# Patient Record
Sex: Female | Born: 1965 | Race: Black or African American | Hispanic: No | Marital: Single | Smoking: Current some day smoker
Health system: Southern US, Community
[De-identification: ages and names within clinical notes are randomized; demographics above are authoritative.]

## PROBLEM LIST (undated history)

## (undated) DIAGNOSIS — J45909 Unspecified asthma, uncomplicated: Secondary | ICD-10-CM

## (undated) DIAGNOSIS — G039 Meningitis, unspecified: Secondary | ICD-10-CM

## (undated) DIAGNOSIS — F329 Major depressive disorder, single episode, unspecified: Secondary | ICD-10-CM

## (undated) DIAGNOSIS — J309 Allergic rhinitis, unspecified: Secondary | ICD-10-CM

## (undated) DIAGNOSIS — F32A Depression, unspecified: Secondary | ICD-10-CM

## (undated) DIAGNOSIS — IMO0001 Reserved for inherently not codable concepts without codable children: Secondary | ICD-10-CM

## (undated) DIAGNOSIS — J40 Bronchitis, not specified as acute or chronic: Secondary | ICD-10-CM

## (undated) HISTORY — DX: Allergic rhinitis, unspecified: J30.9

## (undated) HISTORY — PX: ABDOMINAL HYSTERECTOMY: SHX81

## (undated) HISTORY — DX: Unspecified asthma, uncomplicated: J45.909

---

## 2003-03-05 ENCOUNTER — Emergency Department (HOSPITAL_COMMUNITY): Admission: EM | Admit: 2003-03-05 | Discharge: 2003-03-05 | Payer: Self-pay | Admitting: *Deleted

## 2003-03-05 ENCOUNTER — Encounter: Payer: Self-pay | Admitting: *Deleted

## 2003-07-22 ENCOUNTER — Emergency Department (HOSPITAL_COMMUNITY): Admission: EM | Admit: 2003-07-22 | Discharge: 2003-07-22 | Payer: Self-pay | Admitting: Emergency Medicine

## 2003-07-22 ENCOUNTER — Encounter: Payer: Self-pay | Admitting: Emergency Medicine

## 2003-12-05 ENCOUNTER — Emergency Department (HOSPITAL_COMMUNITY): Admission: EM | Admit: 2003-12-05 | Discharge: 2003-12-05 | Payer: Self-pay | Admitting: Emergency Medicine

## 2004-05-01 ENCOUNTER — Emergency Department (HOSPITAL_COMMUNITY): Admission: EM | Admit: 2004-05-01 | Discharge: 2004-05-01 | Payer: Self-pay | Admitting: Emergency Medicine

## 2004-09-16 ENCOUNTER — Emergency Department (HOSPITAL_COMMUNITY): Admission: EM | Admit: 2004-09-16 | Discharge: 2004-09-16 | Payer: Self-pay | Admitting: Emergency Medicine

## 2004-12-16 ENCOUNTER — Emergency Department (HOSPITAL_COMMUNITY): Admission: EM | Admit: 2004-12-16 | Discharge: 2004-12-16 | Payer: Self-pay | Admitting: Emergency Medicine

## 2004-12-20 ENCOUNTER — Emergency Department (HOSPITAL_COMMUNITY): Admission: EM | Admit: 2004-12-20 | Discharge: 2004-12-20 | Payer: Self-pay | Admitting: Emergency Medicine

## 2005-08-15 ENCOUNTER — Emergency Department (HOSPITAL_COMMUNITY): Admission: EM | Admit: 2005-08-15 | Discharge: 2005-08-15 | Payer: Self-pay | Admitting: Emergency Medicine

## 2005-08-25 ENCOUNTER — Emergency Department (HOSPITAL_COMMUNITY): Admission: EM | Admit: 2005-08-25 | Discharge: 2005-08-25 | Payer: Self-pay | Admitting: Emergency Medicine

## 2006-03-18 ENCOUNTER — Ambulatory Visit: Payer: Self-pay | Admitting: Nurse Practitioner

## 2006-03-26 ENCOUNTER — Ambulatory Visit (HOSPITAL_COMMUNITY): Admission: RE | Admit: 2006-03-26 | Discharge: 2006-03-26 | Payer: Self-pay | Admitting: Family Medicine

## 2006-04-12 ENCOUNTER — Other Ambulatory Visit: Admission: RE | Admit: 2006-04-12 | Discharge: 2006-04-12 | Payer: Self-pay | Admitting: Family Medicine

## 2006-04-12 ENCOUNTER — Ambulatory Visit: Payer: Self-pay | Admitting: Nurse Practitioner

## 2006-04-24 ENCOUNTER — Encounter: Admission: RE | Admit: 2006-04-24 | Discharge: 2006-04-24 | Payer: Self-pay | Admitting: Family Medicine

## 2006-04-29 ENCOUNTER — Ambulatory Visit: Payer: Self-pay | Admitting: Nurse Practitioner

## 2006-05-12 ENCOUNTER — Emergency Department (HOSPITAL_COMMUNITY): Admission: EM | Admit: 2006-05-12 | Discharge: 2006-05-12 | Payer: Self-pay | Admitting: Emergency Medicine

## 2006-08-06 ENCOUNTER — Ambulatory Visit: Payer: Self-pay | Admitting: Nurse Practitioner

## 2006-08-08 ENCOUNTER — Ambulatory Visit (HOSPITAL_COMMUNITY): Admission: RE | Admit: 2006-08-08 | Discharge: 2006-08-08 | Payer: Self-pay | Admitting: Family Medicine

## 2006-08-23 ENCOUNTER — Ambulatory Visit: Payer: Self-pay | Admitting: Nurse Practitioner

## 2006-08-26 ENCOUNTER — Ambulatory Visit (HOSPITAL_COMMUNITY): Admission: RE | Admit: 2006-08-26 | Discharge: 2006-08-26 | Payer: Self-pay | Admitting: Family Medicine

## 2006-09-02 ENCOUNTER — Ambulatory Visit: Payer: Self-pay | Admitting: Nurse Practitioner

## 2006-10-07 ENCOUNTER — Inpatient Hospital Stay (HOSPITAL_COMMUNITY): Admission: EM | Admit: 2006-10-07 | Discharge: 2006-10-08 | Payer: Self-pay | Admitting: Emergency Medicine

## 2006-10-24 ENCOUNTER — Ambulatory Visit (HOSPITAL_COMMUNITY): Admission: RE | Admit: 2006-10-24 | Discharge: 2006-10-25 | Payer: Self-pay | Admitting: Obstetrics and Gynecology

## 2006-10-24 ENCOUNTER — Encounter (INDEPENDENT_AMBULATORY_CARE_PROVIDER_SITE_OTHER): Payer: Self-pay | Admitting: Specialist

## 2007-01-16 ENCOUNTER — Ambulatory Visit: Payer: Self-pay | Admitting: Internal Medicine

## 2007-01-16 ENCOUNTER — Inpatient Hospital Stay (HOSPITAL_COMMUNITY): Admission: EM | Admit: 2007-01-16 | Discharge: 2007-01-17 | Payer: Self-pay | Admitting: Emergency Medicine

## 2007-01-20 ENCOUNTER — Ambulatory Visit: Payer: Self-pay | Admitting: Nurse Practitioner

## 2007-01-27 ENCOUNTER — Ambulatory Visit: Payer: Self-pay | Admitting: Nurse Practitioner

## 2008-02-07 ENCOUNTER — Emergency Department (HOSPITAL_COMMUNITY): Admission: EM | Admit: 2008-02-07 | Discharge: 2008-02-07 | Payer: Self-pay | Admitting: Emergency Medicine

## 2008-04-25 ENCOUNTER — Emergency Department (HOSPITAL_COMMUNITY): Admission: EM | Admit: 2008-04-25 | Discharge: 2008-04-25 | Payer: Self-pay | Admitting: Emergency Medicine

## 2009-04-12 ENCOUNTER — Emergency Department (HOSPITAL_COMMUNITY): Admission: EM | Admit: 2009-04-12 | Discharge: 2009-04-12 | Payer: Self-pay | Admitting: Emergency Medicine

## 2009-12-29 ENCOUNTER — Emergency Department (HOSPITAL_COMMUNITY): Admission: EM | Admit: 2009-12-29 | Discharge: 2009-12-29 | Payer: Self-pay | Admitting: Emergency Medicine

## 2011-02-25 LAB — DIFFERENTIAL
Basophils Absolute: 0 10*3/uL (ref 0.0–0.1)
Eosinophils Relative: 0 % (ref 0–5)
Lymphocytes Relative: 16 % (ref 12–46)
Lymphs Abs: 1.5 10*3/uL (ref 0.7–4.0)
Neutro Abs: 6.9 10*3/uL (ref 1.7–7.7)

## 2011-02-25 LAB — COMPREHENSIVE METABOLIC PANEL
AST: 42 U/L — ABNORMAL HIGH (ref 0–37)
BUN: 9 mg/dL (ref 6–23)
CO2: 22 mEq/L (ref 19–32)
Calcium: 8.3 mg/dL — ABNORMAL LOW (ref 8.4–10.5)
Chloride: 107 mEq/L (ref 96–112)
Creatinine, Ser: 1.04 mg/dL (ref 0.4–1.2)
GFR calc Af Amer: >60 mL/min (ref >60)
GFR calc non Af Amer: 57 mL/min — ABNORMAL LOW (ref >60)
Total Bilirubin: 0.3 mg/dL (ref 0.3–1.2)

## 2011-02-25 LAB — URINE MICROSCOPIC-ADD ON

## 2011-02-25 LAB — URINALYSIS, ROUTINE W REFLEX MICROSCOPIC
Nitrite: NEGATIVE
Protein, ur: 30 mg/dL — AB
Specific Gravity, Urine: 1.027 (ref 1.005–1.030)
Urobilinogen, UA: 0.2 mg/dL (ref 0.0–1.0)

## 2011-02-25 LAB — CBC
HCT: 40.4 % (ref 36.0–46.0)
MCHC: 32.8 g/dL (ref 30.0–36.0)
MCV: 76.5 fL — ABNORMAL LOW (ref 78.0–100.0)
Platelets: 273 10*3/uL (ref 150–400)

## 2011-04-27 NOTE — Consult Note (Signed)
Cindy Harrell, Cindy Harrell                ACCOUNT NO.:  [id number]   MEDICAL RECORD NO.:  [id number]          PATIENT TYPE:  INP   LOCATION:  1830                         FACILITY:  [facility]   PHYSICIAN:  Zola Button T. Lazarus Harrell, M.D. DATE OF BIRTH:  1966-10-31   DATE OF CONSULTATION:  01/16/2007  DATE OF DISCHARGE:                                 CONSULTATION   CHIEF COMPLAINT:  Chronic cough.   HISTORY OF PRESENT ILLNESS:  This 45 year old black female has had  paroxysms of a wracking cough off and on for the past 3-4 months.  The  cough is essentially never productive.  She does not produce any blood.  She denies sinus disease or reflux.  She denies foreign body ingestion  or trauma.  She had a hysterectomy back in November and the cough  predates that intubation.  She does smoke.  She claims to have had  similar coughing episodes in the distant past, but cannot really  describe this.  She has been evaluated in the emergency room for the  possibility of a pulmonary embolism.  CT scan angiogram apparently was  completely normal.  She has a mild elevation of white count following  steroid therapy with a significant left shift, but thus far unexplained.  ENT was called in consultation for the possibility of vocal cord  dysfunction after she failed to respond on several nebulizer treatments.  At present, she does not report dyspnea or actual wheezing, but more  just this significant cough.   PHYSICAL EXAMINATION:  GENERAL:  This is a somewhat tired-appearing  middle-aged black female.  RESPIRATORY:  She does have a vigorous cough which sounds sticky but not  actually productive.  In between, her voice sounds normal and her  breathing is comfortable without any end-expiratory wheezing.  MENTAL STATUS:  Essentially intact.  She hears well in conversational  speech.  HEENT:  The head is atraumatic and neck is supple.  Ear canals are clear  with normal aerated drums.  Anterior nose is clear and not  congested.  Oral cavity is moist with teeth in good repair.  Oropharynx is clear.  I  could not easily see nasopharynx or hypopharynx.  NECK:  Without  adenopathy.  No thyromegaly or tenderness.  Laryngeal crepitus is  normal.   PROCEDURE:  Following 1% viscous Xylocaine to both sides of the nose,  the flexible laryngoscope was introduced.  She has a small midline  submucous retention cyst in the nasopharynx with normal eustachian tori.  Oropharynx is clear.  Hypopharynx reveals some redundant mucosa  postcricoid and some irregularity of the posterior commissure mucosa.  The vocal cords are fully mobile without lesions or contact granulomata.  The airway is good.  No pooling in valleculae or piriformis.  When she  does cough in her standard fashion, the vocal cords appear mobile and do  abduct and adduct appropriately.   IMPRESSION:  Chronic cough.  She has probable chronic laryngitis which I  would initially attribute to reflux.  I would deliberately rule out  chronic sinusitis with a CT scan.  It is possible  this has become an  habitual cough and, in fact, she seemed to be better following the  viscous Xylocaine topical anesthesia of the pharynx.  I do not think  there is complete proof that she does not have an intrinsic pulmonary  process.  I do not see any evidence of wheezing or vocal cord  dysfunction.   PLAN:  CT scan of the sinuses without contrast.  I would be b.i.d.  proton pump inhibitors as a diagnostic and therapeutic trial.  I would  encourage her not to cough heavily if she can avoid it.  A diagnostic  trial of 1% plain Xylocaine by handheld nebulizer might be interesting.  I will leave the remainder of her workup to the capable internal  medicine evaluation she is receiving.      Cindy Harrell, M.D.  Electronically Signed     KTW/MEDQ  D:  01/16/2007  T:  01/16/2007  Job:  [id number]   cc:   Coralie Carpen, M.D.  Cindy Harrell, M.D.

## 2011-04-27 NOTE — Discharge Summary (Signed)
Cindy Harrell, Cindy Harrell                ACCOUNT NO.:  [id number]   MEDICAL RECORD NO.:  [id number]          PATIENT TYPE:  INP   LOCATION:  3735                         FACILITY:  [facility]   PHYSICIAN:  Duncan Dull, M.D.     DATE OF BIRTH:  11-26-1966   DATE OF ADMISSION:  01/16/2007  DATE OF DISCHARGE:  01/17/2007                               DISCHARGE SUMMARY   DISCHARGE DIAGNOSES:  1. Shortness of breath, believed to be multifactorial.  2. Acute on-chronic laryngitis.  3. Chronic bronchitis/chronic obstructive pulmonary disease.  4. Asthma.  5. Acute maxillary sinusitis.  6. Chronic and ongoing tobacco abuse.  7. Gastroesophageal reflux disease.  8. History of menorrhagia status post hysterectomy, November2007.  9. History of postnasal drip.   DISCHARGE MEDICATIONS:  1. Augmentin 500/125 mg 1 tablet p.o. b.i.d. x10 days.  2. Allegra-D 1 tab p.o. daily.  3. Chantix, one week's starter supply with 3 months' refills.  4. AYR nasal saline spray over-the-counter, 2-3 squirts each nostril      p.r.n. nasal congestion.  5. Afrin nasal spray 2-3 sprays each nostril x3 days then stop.  6. Albuterol 90 mcg MDI, 2 puffs inhaled p.r.n. shortness of breath.  7. Advair 100/50, 1 puff inhaled daily.  8. DuoNeb p.r.n. shortness of breath.   DISPOSITION AND FOLLOWUP:  Cindy Harrell is being discharged from [facility] in stable condition.  She is to complete a 10-day course of  oral antibiotics for acute maxillary sinusitis.  Following this, she  should follow-up with her primary care physician at [facility] in approximately 2 weeks.  [facility] does not book this far  out, so she is instructed to call them to gain her appointment again in  2 weeks' time.  At that point, her primary care physician should  evaluate her for full resolution of her acute shortness of breath.  Also, the primary care physician should consider setting Cindy Harrell up  for outpatient pulmonary function  tests.  At this point, I believe [name] suffers from chronic bronchitis/COPD.  However, documentation of  an obstructive airway disease, if there is a reversible component may be  helpful in the future.   PROCEDURES PERFORMED:  1. Chest x-ray revealed changes compatible with obstructive airways      disease, asthma, no focal air space disease.  The lungs were hyper-      expanded.  CT angiography of the chest revealed no pulmonary      embolus or acute cardiopulmonary disease.  She did have 3-4 sub-      centimeter nodules.  These are likely benign non-calcified      granulomas or subpleural lymph nodes.  Follow-up CT is recommended      in 9 months, to ensure stability or resolution.  2. Maxillofacial CT scan revealed acute left maxillary sinusitis with      an air-fluid level, with scattered minor sinus mucosal thickening.  3. Flexible laryngoscopy revealed midline sub-mucous retention cyst in      the nasopharynx with normal eustachian tori, oropharynx clear,  hypopharynx revealed some redundant mucosa, postcricoid and some      irregularity of the posterior commissure mucosa.  Vocal cords fully      mobile without lesions or contact granulomata.  Airway is good.  No      pooling in valleculae or piriformis.  When she does cough in the      standard fashion, vocal cords appear mobile and do abduct and      adduct appropriately.   CONSULTATIONS:  Dr. Flo Shanks of Otolaryngology.   BRIEF ADMISSION HISTORY AND PHYSICAL:  Cindy Harrell is a 45 year old  African-American woman with a past history of chronic bronchitis, who  presents with a 2-day history of worsening shortness of breath and  cough.  Her symptoms started with a prodromal of sore throat, fevers,  chills, approximately 3 days prior to admission.  She notably was in [place] with her family, learning of her mother's recent diagnosis of a  neuro-endocrine tumor.  She began feeling short of breath when the  weather  became cold there and started snowing, approximately 2 days  prior to arrival.  She was treating herself with over-the-counter cough  syrup and Advair, with no relief of her symptoms over that day.  Approximately 1 day prior to admission, the patient returned to  [facility] via a long car trip from [place], and her shortness of  breath and cough were progressively worsened.  She began using her  albuterol MDI and DuoNebs again, without significant relief of her  symptoms.  On the morning of admission, she was attempting leave her  home and subjectively he felt quite distressed.  Her shortness of breath  was severe and she was moving very little air in her opinion.  Exacerbating this was a persistent hacking, nonproductive cough.  She  came to the ED for further evaluation.  Please see the chart for further  details.   ADMISSION PHYSICAL EXAM:  VITAL SIGNS:  Temperature 98.5, blood pressure  155/85, pulse 146, respiratory rate of 40, O2 SATs 92% on room air.  GENERAL:  The patient was alert, oriented.  She was in mild to moderate  respiratory distress.  HEENT:  Pupils were pinpoint but reactive to light.  NECK:  She had no cervical adenopathy, no thyromegaly.  Neck was supple.  RESPIRATORY:  Exam revealed the tachypnea with no wheezes, rhonchi,  occasional scattered rales.  However, this was after Solu-Medrol, as  well as nebulizers.  CARDIOVASCULAR:  Tachycardic but regular.  No obvious murmurs, rubs or  gallops.  ABDOMEN:  Soft, nontender, nondistended.  EXTREMITIES:  Reveal no edema.  NEUROLOGIC:  Cranial nerves II-XII were grossly intact.  No focal  deficit, strength or sensation.   ADMISSION LABS:  Sodium 134, potassium 3.1, chloride 103, bicarb 21, BUN  7, creatinine 0.9, glucose 121, bilirubin 0.4, alk phos 70, AST 51, ALT  26, protein 6.6, albumin 3.5, calcium 8.8, white blood cell count 11.3,  hemoglobin 11.9, platelets 268, ANC 10.7, MCV of 75.9, D-dimer 2.39. Arterial  blood gas:  pH 7.39, pCO2 32.3, pO2 68, this on 1 liter nasal  cannula, bicarb 19.5.  EKG revealed normal sinus rhythm with nonspecific  T-wave changes.  Imaging was as described above in procedures.   HOSPITAL COURSE:  1. Shortness of breath.  Given the history of recent travel, PE was      suspected but ruled out with a CT angiography.  There were several      nodes as  described above, which needed to be followed up in      approximately 9 months' time with a repeat chest CT.  I believe her      shortness of breath was probably multifactorial including an acute      on-chronic bronchitis, an acute on-chronic laryngitis, worsened by      chronic obstructive pulmonary disease and a question of reversible      airway disease or asthma.  For this reason, the patient was treated      with IV steroids, nebulized bronchodilators, as well as      antibiotics.  I am discharging the patient on a steroid taper of 60      mg, to be tapered down by 10 mg each day over the next 6 days.  The      patient is also being discharged on a decongestant and      antihistamine and Allegra-D, as well as AYR nasal saline spray and      3 days worth of Afrin nasal spray.  She has been instructed to      continue her Advair every day and to use her albuterol and DuoNeb      as needed.  I think obtaining pulmonary function tests will further      clarify the obstructive component, as well as the reversible      component of her pulmonary disease.  Judging by her chest x-ray of      hyperexpansion, she does have changes consistent with COPD, and I      do believe the patient has a component of asthma secondary to the      exacerbation of cold, as well as trigger of her disease from moving      to [place].  2. Acute maxillary sinusitis, diagnosed by CT of sinuses.  The patient      was treated initially with Unasyn which was transitioned to      Augmentin.  She will complete a total of 10 days of therapy.       Again, she will use AYR nasal spray to relieve any congestion,      along with aspirin nasal spray for 3 days.  The patient is      understanding of this and is instructed to return the hospital or      her primary care physician, if her symptoms worsen.  3. Hypokalemia.  I believe this may be secondary to use of beta      agonist.  The patient was repleted, and her potassium on day of      discharge was 4.0.  4. Polysubstance abuse.  Urine drug screen was positive for opiates as      well as THC.  I will refer the patient to narcotics anonymous in      regards to her drug use.  I believe smoking marijuana most likely      is exacerbating her condition.  5. Chronic and ongoing tobacco abuse.  The patient is finally      interested in quitting smoking.  I have given her prescription for      a starter pack of Chantix, as well as 3 months of refills.  6. Leukocytosis.  I believe this is secondary to her acute infection,     as well as stress response to steroids.  Follow up with the primary      care physician should include a CBC to ensure full resolution  of      her leukocytosis.  She is afebrile and clinically much improved and      stable at the time of discharge.  7. Tachycardia on admission.  I believe this was secondary to the beta      agonist she was receiving.  On the day of discharge, her pulse was      listed as 106 in E-chart.  I have rechecked it manually, and her      pulse was 80-85 by my check.  Again, I think this is secondary to      beta stimulation.  Cardiac enzymes were obtained x3 which revealed      a troponin of 0.02, 0.03, this was reassuring.  Notably, she did      have an elevation in her CK, which I think is secondary to the      vigorous coughing that she has had and muscle inflammation.      Creatinine stable, 0.68.   DISCHARGE LABS AND VITALS:  Temperature 98.0, blood pressure 127/88,  pulse of 80-85, respiratory rate of 20.  She was satting 95% on  room  air.   LABS:  White blood cell count 14.9, hemoglobin 12.1, platelets 265,  sodium 135, potassium 4.0, chloride 26, bicarb 22, BUN 8, creatinine  0.68, glucose 125.  Hemoglobin A1c was 5.7.  Coags were normal with a PT  of 16, PTT of 33.  Influenza A and B nasal swab were negative.  HIV was  nonreactive.  ESR was 10.  Ferritin was 114.  BNP was 56.      Antony Contras, M.D.  Electronically Signed      Duncan Dull, M.D.  Electronically Signed    GL/MEDQ  D:  01/17/2007  T:  01/18/2007  Job:  [id number]   cc:   Gloris Manchester. Lazarus Salines, M.D.

## 2011-04-27 NOTE — Op Note (Signed)
NAMEJAZZ, Cindy Harrell                ACCOUNT NO.:  [id number]   MEDICAL RECORD NO.:  [id number]          PATIENT TYPE:  AMB   LOCATION:  SDC                           FACILITY:  [facility]   PHYSICIAN:  Zenaida Niece, M.D.DATE OF BIRTH:  May 15, 1966   DATE OF PROCEDURE:  10/24/2006  DATE OF DISCHARGE:                                 OPERATIVE REPORT   PREOPERATIVE DIAGNOSES:  1. Menorrhagia.  2. Dysmenorrhea.  3. Pelvic pain.  4. Cystocele.  5. Stress urinary incontinence.   PROCEDURES:  Laparoscopic-assisted vaginal hysterectomy with anterior  colporrhaphy and TVT Secur.   SURGEON:  Zenaida Niece, M.D.   ASSISTANT:  Huel Cote, M.D.   ANESTHESIA:  General endotracheal tube.   SPECIMENS:  Uterus sent to pathology.   ESTIMATED BLOOD LOSS:  450 mL.   COMPLICATIONS:  None.   FINDINGS:  She had a normal upper abdomen and appendix.  Uterus was slightly  enlarged and broad.  She had normal tubes and ovaries with evidence of prior  tubal ligation.  She had a grade I-II cystocele.  She had no significant  rectocele.   PROCEDURE IN DETAIL:  The patient was taken to the operating room and placed  in the dorsal supine position.  General anesthesia was induced and she was  placed in mobile stirrups.  Abdomen, perineum and vagina were then prepped  and draped in the usual sterile fashion.  The bladder was drained with a red  rubber catheter.  Infraumbilical skin was then infiltrated with 0.25%  Marcaine and a 1 cm horizontal incision was made in a fold.  A disposable  Veress needle was inserted into the peritoneal cavity and placement  confirmed by the water drop test and an opening pressure of 6 mmHg.  CO2 gas  was insufflated to a pressure of 12 mmHg and the Veress needle was removed.  A 5 mm XL trocar was then introduced with direct visualization.  The 5 mm  ports were then placed on each side lateral to the epigastric vessels also  under direct visualization.  Inspection  revealed the above-mentioned  findings.  A 5 mm laparoscopic tenaculum was used to grasp the uterus and  starting on the patient's right side, the fallopian tube, utero-ovarian  pedicle, round ligament and broad ligaments were taken down with the  Harmonic scalpel ACE.  There was scarring in the anterior cul-de-sac from  her prior cesarean section and this was taken down in layers with the  Harmonic scalpel.  Uterine arteries were skeletonized on the right and taken  down with Harmonic scalpel.  On the left side there  an adhesion of the  ovary to the ovarian fossa and this was taken down with Harmonic scalpel.  The uterus was fairly broad at the cornu and this made taking this down the  pedicles slightly difficult.  Fallopian tube, utero-ovarian pedicle, round  ligament and broad ligament were taken down with the Harmonic scalpel ACE.  Bipolar cautery was used to assure hemostasis.  The anterior peritoneum was  identified and incised across the anterior portion of uterus,  again in  layers due to the scar tissue and the bladder was pushed inferior.  Uterine  arteries were skeletonized and taken down with Harmonic scalpel ACE.  All  pedicles were hemostatic and we proceeded vaginally.   Legs were elevated in the stirrups and a weighted speculum was placed into  the vagina.  Deaver retractors were used anteriorly and laterally.  Cervix  was grasped with Christella Hartigan tenaculums and the cervicovaginal mucosa was  infiltrated with dilute solution of Pitressin.  The cervicovaginal mucosa  was then incised circumferentially with electrocautery.  Sharp dissection  was then used to further free the vagina from the cervix.  Anterior  peritoneum was identified and entered bluntly and a Deaver retractor used to  retract the bladder anteriorly.  Posterior cul-de-sac was mobilized and  entered sharply and a Bonanno speculum placed into the posterior cul-de-sac.  Uterosacral pedicles were then clamped,  transected and ligated on each side  with #1 chromic and tagged for later use.  The remainder of the cardinal  ligament and uterine arteries were were clamped, transected and ligated with  #1 chromic and the uterus was removed.  Bleeding from the patient's right  side was controlled with two figure-of-eight sutures of #1 chromic and all  other pedicles were hemostatic.  Suture of 2-0 silk was used to plicate the  uterosacral ligaments in the midline.  The previously tagged uterosacral  pedicles were also tied in the midline.  Vagina was left open at this point.   Attention was turned to the anterior colporrhaphy.  The anterior vaginal  apex was grasped with Allis clamps and the vagina dissected in the midline  to within about 3 cm of the urethral meatus.  Cystocele was then mobilized  laterally, both sharply and bluntly.  Cystocele was then reduced with  interrupted sutures of 2-0 Vicryl.  Bleeding was controlled with  electrocautery and interrupted sutures of 3-0 Vicryl.  Excess vaginal mucosa  was then removed sharply.  The anterior colporrhaphy incision was closed in  a vertical fashion with running locking 2-0 Vicryl to the vaginal apex.  The  vaginal cuff was then also closed with running locking 2-0 Vicryl in a  vertical fashion with adequate closure and adequate hemostasis.   No significant rectocele was identified so attention was turned to the TVT.  The legs were lowered about halfway in the stirrups.  The vagina was grasped  at a distance that was felt to be at the mid urethra.  A vertical incision  was made and carried out a short distance laterally with the knife.  Sharp  dissection with Metzenbaum scissors then dissected out to the pubic ramus  bilaterally.  This was wide enough to allow the knife handle to pass to the  pubic ramus.  The TVT Secur device was then placed first on the patient's right side and then on the patient's left side just behind the pubic ramus.  A  right-angle clamp was able to be passed between the urethra and the tape.  It was slightly snug at first but then the TVT was slightly removed and good  tension was achieved.  The needles were freed from the sling and removed.  The vaginal mucosa was closed with running locking 2-0 Vicryl.  Cystoscopy  was performed.  She had been given indigo carmine IV.  There was no injury  to the bladder and both ureteral orifices were easily identified and had  good efflux of blue urine.  No injury  to the urethra or the bladder from the  sling or the anterior repair.  The cystoscope was removed and a Foley  catheter was placed.  Of note, prior to placement of TVT, the bladder had  been drained.  The legs were now elevated again in stirrups.  Two inch  packing was placed into the vagina after it was again inspected and found to  be hemostatic.   Legs were lowered and attention was turned back to laparoscopy.  Both Dr.  Senaida Ores and I and the scrub techs changed gloves.  The abdomen was  reinsufflated with CO2 gas and inspected.  It was copiously irrigated.  A  small amount of bleeding from the right infundibulopelvic pedicle was  controlled with bipolar cautery.  The remainder of the pelvis was  hemostatic.  The lateral ports were removed with direct visualization.  All  gas was allowed to deflate from the abdomen and the umbilical trocar was  then removed.  Skin incisions were closed with interrupted  subcuticular sutures of 4-0 Vicryl followed by Dermabond.  The patient  tolerated the procedure well.  She was extubated in the operating room and  taken to the recovery room in stable condition.  Counts were correct x2, she  was given Ancef 1 gram prior to the procedure and had PAS hose on throughout  procedure.      Zenaida Niece, M.D.  Electronically Signed     TDM/MEDQ  D:  10/24/2006  T:  10/24/2006  Job:  [id number]

## 2011-04-27 NOTE — Discharge Summary (Signed)
Cindy Harrell, Cindy Harrell                ACCOUNT NO.:  [id number]   MEDICAL RECORD NO.:  [id number]          PATIENT TYPE:  INP   LOCATION:  4731                         FACILITY:  [facility]   PHYSICIAN:  Lonia Blood, M.D.       DATE OF BIRTH:  [birth year]   DATE OF ADMISSION:  10/07/2006  DATE OF DISCHARGE:  10/08/2006                                 DISCHARGE SUMMARY   PRIMARY CARE PHYSICIAN:  [facility].   DISCHARGE DIAGNOSES:  1. Acute bronchitis.  2. Tobacco abuse.  3. Mild asthma exacerbation.  4. Menorrhalgia.  5. Mild gastroesophageal reflux disease.  6. Postnasal drip.   DISCHARGE MEDICATIONS:  1. Advair 250/50 mg, one puff twice a day.  2. Singulair 10 mg daily.  3. Albuterol MDI, 1 puff four times a day as needed.  4. Doxycycline 100 mg by mouth for a week.  5. Rhinocort 1 spray twice a day.  6. Omeprazole 20 mg daily.  7. Prednisone taper   CONDITION ON DISCHARGE:  [name] was discharged in good condition.   At time of discharge she was instructed to follow up with her primary care  physician at [facility].   PROCEDURE:  During this admission, on October 07, 2006, the patient  underwent a chest x-ray PA and lateral with the findings of clear lungs and  no active cardiopulmonary disease.   CONSULTATION:  No consultations were obtained.   For admission history and physical please refer to the dictated H&P done by  Dr. Jetty Duhamel on October 07, 2006.   HOSPITAL COURSE:  Acute bronchitis and asthma, COPD exacerbation.  [name]  was admitted with complaints of persistent coughing and shortness of breath.  She was placed on high doses of steroids and nebulizer treatment three times  a day.  By hospital day #2, her symptoms improved significantly.  She was  evaluated the next day and was found to be without any respiratory distress  and her lung exam was pretty unremarkable.  The patient reported that she  has been having recurrent bronchitic events throughout her  life and then she  has been having trouble with breathing attacks at night.  It is felt that  this patient may suffer from possible chronic bronchitis from tobacco abuse  and she may have also a case of hyper reactive airways from probable mild  asthma.  In any case, [name] was instructed to quit smoking cigarettes  and she was strongly encouraged to use Chantix to help her with  the craving symptoms.  Also [name] was given state of the art treatment  for her chronic cough with a cough suppressant, acid suppression medication,  and also a nasal cortisone spray.  Further decisions about [name]'s  chronic lung condition should be done by her primary care physician at  [facility].      Lonia Blood, M.D.  Electronically Signed     SL/MEDQ  D:  10/09/2006  T:  10/09/2006  Job:  [id number]   cc:   Dala Dock

## 2011-04-27 NOTE — H&P (Signed)
[name], [name]                ACCOUNT NO.:  [id number]   MEDICAL RECORD NO.:  [id number]          PATIENT TYPE:  INP   LOCATION:  1826                         FACILITY:  [facility]   PHYSICIAN:  Lonia Blood, M.D.DATE OF BIRTH:  06-28-1966   DATE OF ADMISSION:  10/07/2006  DATE OF DISCHARGE:                                HISTORY & PHYSICAL   CHIEF COMPLAINT:  Shortness of breath.   HISTORY OF PRESENT ILLNESS:  [name]. Cindy Harrell is a pleasant 45 year old  female who smokes less than a pack a day but has done so for greater than 20  years.  She has frequent trouble with exacerbations of acute bronchitis and  with asthma/emphysema flares.  Over the last month, she has visited her  doctor at [facility] on multiple occasions.  She was previously placed on  Cipro.  Over the past 1 week, she has had gradually progressive difficulty  with shortness of breath.  This has been accompanied by a cough  intermittently productive of thick, green and yellow phlegm.  Over the last  24 hours, her symptoms have worsened.  Despite use of home albuterol  and  Atrovent nebs, as well as her Advair, Zyrtec and Singulair, her symptoms  worsened to the point she felt that she could no longer catch her breath.  She felt a severe sensation of tightness across her entire chest, like a  band tightening down.  She therefore presented to the emergency room.  In  the emergency room, she was dosed with IV Solu-Medrol 125 mg.  She was given  frequent nebs.  Symptoms improved but did not resolve sufficiently to allow  discharge home.  I am therefore called to admit her to the hospital.  At  present, the patient is resting comfortably in a hospital stretcher.  She  continues to report some difficulty with respiratory distress.  She is able  to be heard wheezing as one enters the room.  She has no other significant  complaints at the present time.   REVIEW OF SYSTEMS:  Patient has a known very large fibroid  which has been  causing painful intercourse and heavy bleeding with her menses.  She is  scheduled to see a gynecologist at 1:30 in the afternoon on October 08, 2006  to have this evaluated.  Otherwise, comprehensive review of systems is  unremarkable, with the exception of multiple positive elements noted in the  history of present illness above.   PAST MEDICAL HISTORY:  1. Tobacco abuse in the amount of less than 1 pack per day x 20+ years.  2. Chronic bronchitis.  3. Status post C-section.  4. Known uterine fibroids.  Schedule for a GYN evaluation and hopeful      resection.   OUTPATIENT MEDICATIONS:  1. Advair.  2. Zyrtec.  3. Albuterol and Atrovent nebs.  4. Cipro 500 mg b.i.d., prescribed August 23, 2006 but not taken      consistently.  5. Singulair q. day.   ALLERGIES:  NO KNOWN DRUG ALLERGIES.   FAMILY HISTORY:  Patient's mother is alive with  diabetes and a neuro-  endocrine cancer.  Patient's father is unknown.  Patient's brother has  sickle cell disease.   SOCIAL HISTORY:  The patient occasionally partakes of alcohol.  She admits  to frequent marijuana use.  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  She is  engaged to be married.   DATA:  Reviewed.  Hemoglobin is low at 11, with MCV of 76.  White count is  normal.  Platelet count is normal.  Sodium is normal.  Potassium is low at  3.2.  Chloride, bicarb, BUN and creatinine are normal.  Serum glucose is  114.  Chest x-ray reveals no acute disease.  There are obvious findings  consistent with asthma versus emphysema.   PHYSICAL EXAMINATION:  VITAL SIGNS:  Temperature 99.1, blood pressure  123/83, heart rate 120, respiratory rate 26, O2 sat is 98% on room air.  GENERAL:  Well-developed, well-nourished female in mild respiratory  distress, using accessory muscles.  HEENT:  Normocephalic, atraumatic.  Pupils equal, round, reactive to light  and accommodation.  Extraocular muscles intact bilaterally.  Bilateral OC/OP   clear.  NECK:  No JVD, no lymphadenopathy, no thyromegaly.  LUNGS:  Diffuse expiratory wheeze throughout with prolonged expiratory phase  and use of expiratory muscles and pursed lip breathing.  No focal crackles  but course upper airway sounds throughout.  CARDIOVASCULAR:  Tachycardic but regular without gallop or rub, normal S1  and S2.  ABDOMEN:  Nontender, nondistended, soft.  Bowel sounds present.  No  hepatosplenomegaly, no rebound, no ascites.  EXTREMITIES:  No significant cyanosis, clubbing, edema bilateral lower  extremities.  NEUROLOGIC:  Alert and oriented x4.  Cranial nerves 2-12 intact.  MUSCULOSKELETAL:  5/5 strength, bilateral upper and lower extremities, no  Babinski.  SKIN:  Patient has what appears to be an epidermal inclusion cyst at the  left earlobe.  When gloves are adorned and site pressure is placed, a  significant amount of sebum is able to be expressed from the area.  There is  no severe erythema.  There is no evidence of overlying cellulitis.  There  area is cleaned with rubbing alcohol and will be monitored.   IMPRESSION/PLAN:  1. Acute exacerbation of chronic obstructive pulmonary disease.  Patient      will be placed on IV Solu-Medrol at 80 mg IV q.8.h.  We will place her      on albuterol and Atrovent nebs on a regular basis.  She will be      observed closely.  O2 will be administered on a p.r.n. basis.  2. Acute bronchitis.  A bout of acute bronchitis is likely the inciting      event of patient's acute COPD/emphysema exacerbation.  I am placing of      the patient's on IV antibiotics.  We will place the patient on an      expectorant.  We will follow her symptoms closely.  Follow-up chest x-      ray will be accomplished in the morning.  At present, there is no      evidence of a pneumonia.  3. Tobacco abuse.  Patient has been counseled extensively as to the direct     connection between her ongoing tobacco abuse and her current illness.      She has  been advised of the multiple deleterious affects of ongoing      tobacco abuse.  She has been counseled to discontinue tobacco abuse  immediately.  A tobacco cessation consultation will be requested to      further drive this point home during her hospital stay.  4. Normocytic anemia.  The patient reports severe heavy bleeding with her      menses.  She has a known uterine fibroid.  At this point, we will      presume that this is the cause of her normocytic anemia and will not      pursue this further.  After such time that the patient's fibroid has      been addressed, patient hemoglobin will need to be followed closely.      Lonia Blood, M.D.  Electronically Signed    JTM/MEDQ  D:  10/07/2006  T:  10/07/2006  Job:  [id number]

## 2011-04-27 NOTE — H&P (Signed)
NAMEANGELIAH, WISDOM                ACCOUNT NO.:  [id number]   MEDICAL RECORD NO.:  [id number]          PATIENT TYPE:  AMB   LOCATION:  SDC                           FACILITY:  [facility]   PHYSICIAN:  Zenaida Niece, M.D.DATE OF BIRTH:  July 10, 1966   DATE OF ADMISSION:  10/24/2006  DATE OF DISCHARGE:                                HISTORY & PHYSICAL   CHIEF COMPLAINT:  Menorrhagia, dysmenorrhea, pelvic pain, cystocele and  stress urinary incontinence.   HISTORY OF PRESENT ILLNESS:  This is a 45 year old black female, gravida 4,  para 2-0-2-2, whom I saw initially on October 08, 2006.  She complains of  menses every month which are significantly heavy and crampy.  She has had an  ultrasound and MRI which document a small fibroid.  She also has fairly  constant pelvic pain.  She also is sexually active with dyspareunia  approximately 90% of the time, and also complains of urine leakage with  cough to the point where she has to wear a pad.   Exam in the office revealed a grade 1-2 cystocele, and she did leak just on  exam with straining.  On bimanual exam, uterus is anteverted, upper limits  of normal size, and slightly tender, and she has slightly tender bilateral  adnexa without masses.   All options have been discussed with the patient, and she wishes to proceed  with definitive surgical therapy.   PAST OBSTETRICAL HISTORY:  One vaginal delivery at term and one low  transverse cesarean section, as well as 2 elective terminations.   PAST MEDICAL HISTORY:  1. History of meningitis.  2. Asthma.  3. Bronchitis.  (The patient has no history of venous thromboembolic disease).   PAST SURGICAL HISTORY:  1. Tubal ligation.  2. Cesarean section.   ALLERGIES:  None known.   CURRENT MEDICATIONS:  1. Advair.  2. Singulair.  3. Rhinocort.  4. Prednisone.  5. Protonix.  6. Albuterol.  7. Chantix.  8. Doxycycline.   GYNECOLOGIC HISTORY:  No history of abnormal Pap smears or  sexually  transmitted diseases.   FAMILY HISTORY:  Mother had liver and pancreatic cancer of some  neuroendocrine origin.   REVIEW OF SYSTEMS:  She has normal bowel movements, and again does have  stress incontinence.   SOCIAL HISTORY:  She is single and does smoke about 5 cigarettes a day.   PHYSICAL EXAMINATION:  GENERAL:  This is a well-developed black female in no  acute distress.  VITAL SIGNS:  Weight is 143 pounds.  NECK:  Supple without lymphadenopathy or thyromegaly.  LUNGS:  Currently are clear to auscultation.  HEART:  Regular rate and rhythm without murmur.  ABDOMEN:  Soft and nondistended, but is slightly tender in both lower  quadrants.  She has no palpable masses, and does have a vertical scar from  her cesarean section.  EXTREMITIES:  No edema and are nontender.  PELVIC:  External genitalia has no lesions.  On speculum exam, cervix is  normal, and she has a grade 1-2 cystocele.  Again, on bimanual exam, she has  an anteverted  uterus, upper limits of normal size, that is slightly tender  and slightly tender adnexa without mass.   ASSESSMENT:  1. Menorrhagia.  2. Dysmenorrhea.  3. Pelvic pain.  4. Leiomyomatous uterus.  5. Cystocele with stress incontinence.   All non-surgical and surgical options have been discussed with the patient,  and she wishes to proceed with definitive surgical therapy.  Risks of  surgery have been discussed, and she agrees to proceed.  The plan is to  admit the patient on the day of surgery for laparoscopic-assisted vaginal  hysterectomy with anterior repair and possible posterior repair, as well as  a TVT SECUR.      Zenaida Niece, M.D.  Electronically Signed     TDM/MEDQ  D:  10/23/2006  T:  10/23/2006  Job:  [id number]

## 2011-07-24 ENCOUNTER — Emergency Department (HOSPITAL_COMMUNITY): Payer: Self-pay

## 2011-07-24 ENCOUNTER — Emergency Department (HOSPITAL_COMMUNITY)
Admission: EM | Admit: 2011-07-24 | Discharge: 2011-07-24 | Disposition: A | Discharge status: Home / Self Care | Payer: Self-pay | Attending: Emergency Medicine | Admitting: Emergency Medicine

## 2011-07-24 DIAGNOSIS — R059 Cough, unspecified: Secondary | ICD-10-CM | POA: Insufficient documentation

## 2011-07-24 DIAGNOSIS — J4489 Other specified chronic obstructive pulmonary disease: Secondary | ICD-10-CM | POA: Insufficient documentation

## 2011-07-24 DIAGNOSIS — J40 Bronchitis, not specified as acute or chronic: Secondary | ICD-10-CM | POA: Insufficient documentation

## 2011-07-24 DIAGNOSIS — R079 Chest pain, unspecified: Secondary | ICD-10-CM | POA: Insufficient documentation

## 2011-07-24 DIAGNOSIS — J449 Chronic obstructive pulmonary disease, unspecified: Secondary | ICD-10-CM | POA: Insufficient documentation

## 2011-07-24 DIAGNOSIS — R05 Cough: Secondary | ICD-10-CM | POA: Insufficient documentation

## 2014-10-28 ENCOUNTER — Emergency Department (HOSPITAL_COMMUNITY): Payer: Self-pay

## 2014-10-28 ENCOUNTER — Encounter (HOSPITAL_COMMUNITY): Payer: Self-pay | Admitting: *Deleted

## 2014-10-28 ENCOUNTER — Inpatient Hospital Stay (HOSPITAL_COMMUNITY)
Admission: EM | Admit: 2014-10-28 | Discharge: 2014-10-31 | DRG: 190 | Disposition: A | Discharge status: Home / Self Care | Payer: Self-pay | Attending: Internal Medicine | Admitting: Internal Medicine

## 2014-10-28 DIAGNOSIS — Z681 Body mass index (BMI) 19 or less, adult: Secondary | ICD-10-CM

## 2014-10-28 DIAGNOSIS — J209 Acute bronchitis, unspecified: Secondary | ICD-10-CM | POA: Diagnosis present

## 2014-10-28 DIAGNOSIS — R634 Abnormal weight loss: Secondary | ICD-10-CM | POA: Diagnosis present

## 2014-10-28 DIAGNOSIS — J441 Chronic obstructive pulmonary disease with (acute) exacerbation: Principal | ICD-10-CM | POA: Diagnosis present

## 2014-10-28 DIAGNOSIS — F17210 Nicotine dependence, cigarettes, uncomplicated: Secondary | ICD-10-CM | POA: Diagnosis present

## 2014-10-28 DIAGNOSIS — Z139 Encounter for screening, unspecified: Secondary | ICD-10-CM

## 2014-10-28 DIAGNOSIS — IMO0001 Reserved for inherently not codable concepts without codable children: Secondary | ICD-10-CM

## 2014-10-28 DIAGNOSIS — Z720 Tobacco use: Secondary | ICD-10-CM | POA: Diagnosis present

## 2014-10-28 DIAGNOSIS — E43 Unspecified severe protein-calorie malnutrition: Secondary | ICD-10-CM | POA: Diagnosis present

## 2014-10-28 HISTORY — DX: Major depressive disorder, single episode, unspecified: F32.9

## 2014-10-28 HISTORY — DX: Depression, unspecified: F32.A

## 2014-10-28 HISTORY — DX: Reserved for inherently not codable concepts without codable children: IMO0001

## 2014-10-28 HISTORY — DX: Bronchitis, not specified as acute or chronic: J40

## 2014-10-28 HISTORY — DX: Meningitis, unspecified: G03.9

## 2014-10-28 LAB — BASIC METABOLIC PANEL
ANION GAP: 18 — AB (ref 5–15)
BUN: 9 mg/dL (ref 6–23)
CALCIUM: 9.2 mg/dL (ref 8.4–10.5)
CO2: 19 mEq/L (ref 19–32)
Chloride: 101 mEq/L (ref 96–112)
Creatinine, Ser: 0.66 mg/dL (ref 0.50–1.10)
GFR calc Af Amer: >90 mL/min (ref >90)
Glucose, Bld: 109 mg/dL — ABNORMAL HIGH (ref 70–99)
Potassium: 3.8 mEq/L (ref 3.7–5.3)
SODIUM: 138 meq/L (ref 137–147)

## 2014-10-28 LAB — CBC
HCT: 36.3 % (ref 36.0–46.0)
Hemoglobin: 12.2 g/dL (ref 12.0–15.0)
MCH: 24.4 pg — AB (ref 26.0–34.0)
MCHC: 33.6 g/dL (ref 30.0–36.0)
MCV: 72.5 fL — ABNORMAL LOW (ref 78.0–100.0)
PLATELETS: 316 10*3/uL (ref 150–400)
RBC: 5.01 MIL/uL (ref 3.87–5.11)
RDW: 15.7 % — AB (ref 11.5–15.5)
WBC: 7.5 10*3/uL (ref 4.0–10.5)

## 2014-10-28 MED ORDER — ENOXAPARIN SODIUM 30 MG/0.3ML ~~LOC~~ SOLN
30.0000 mg | SUBCUTANEOUS | Status: DC
Start: 2014-10-28 — End: 2014-10-29
  Filled 2014-10-28 (×3): qty 0.3

## 2014-10-28 MED ORDER — PREDNISONE 20 MG PO TABS
40.0000 mg | ORAL_TABLET | Freq: Once | ORAL | Status: AC
  Administered 2014-10-28: 40 mg via ORAL
  Filled 2014-10-28: qty 2

## 2014-10-28 MED ORDER — LEVOFLOXACIN 500 MG PO TABS
500.0000 mg | ORAL_TABLET | ORAL | Status: DC
  Administered 2014-10-28 – 2014-10-30 (×3): 500 mg via ORAL
  Filled 2014-10-28 (×4): qty 1

## 2014-10-28 MED ORDER — ALBUTEROL SULFATE (2.5 MG/3ML) 0.083% IN NEBU: 2.5000 mg | INHALATION_SOLUTION | RESPIRATORY_TRACT | Status: DC | PRN

## 2014-10-28 MED ORDER — IPRATROPIUM-ALBUTEROL 0.5-2.5 (3) MG/3ML IN SOLN
3.0000 mL | RESPIRATORY_TRACT | Status: DC
  Administered 2014-10-28 – 2014-10-30 (×11): 3 mL via RESPIRATORY_TRACT
  Filled 2014-10-28 (×11): qty 3

## 2014-10-28 MED ORDER — ZOLPIDEM TARTRATE 5 MG PO TABS
5.0000 mg | ORAL_TABLET | Freq: Once | ORAL | Status: AC
  Administered 2014-10-29: 5 mg via ORAL
  Filled 2014-10-28: qty 1

## 2014-10-28 MED ORDER — ONDANSETRON HCL 4 MG PO TABS: 4.0000 mg | ORAL_TABLET | Freq: Four times a day (QID) | ORAL | Status: DC | PRN

## 2014-10-28 MED ORDER — METHYLPREDNISOLONE SODIUM SUCC 125 MG IJ SOLR
80.0000 mg | Freq: Four times a day (QID) | INTRAMUSCULAR | Status: DC
  Administered 2014-10-28 – 2014-10-30 (×7): 80 mg via INTRAVENOUS
  Filled 2014-10-28 (×11): qty 1.28

## 2014-10-28 MED ORDER — GUAIFENESIN ER 600 MG PO TB12
600.0000 mg | ORAL_TABLET | Freq: Two times a day (BID) | ORAL | Status: DC
  Administered 2014-10-28 – 2014-10-31 (×6): 600 mg via ORAL
  Filled 2014-10-28 (×7): qty 1

## 2014-10-28 MED ORDER — ALBUTEROL (5 MG/ML) CONTINUOUS INHALATION SOLN
10.0000 mg/h | INHALATION_SOLUTION | RESPIRATORY_TRACT | Status: DC
  Administered 2014-10-28: 10 mg/h via RESPIRATORY_TRACT
  Filled 2014-10-28: qty 20

## 2014-10-28 MED ORDER — ACETAMINOPHEN 325 MG PO TABS
650.0000 mg | ORAL_TABLET | Freq: Four times a day (QID) | ORAL | Status: DC | PRN
  Administered 2014-10-29: 650 mg via ORAL
  Filled 2014-10-28: qty 2

## 2014-10-28 MED ORDER — ONDANSETRON HCL 4 MG/2ML IJ SOLN: 4.0000 mg | Freq: Four times a day (QID) | INTRAMUSCULAR | Status: DC | PRN

## 2014-10-28 MED ORDER — ALBUTEROL SULFATE (2.5 MG/3ML) 0.083% IN NEBU
5.0000 mg | INHALATION_SOLUTION | Freq: Once | RESPIRATORY_TRACT | Status: AC
  Administered 2014-10-28: 5 mg via RESPIRATORY_TRACT
  Filled 2014-10-28: qty 6

## 2014-10-28 MED ORDER — ACETAMINOPHEN 650 MG RE SUPP: 650.0000 mg | Freq: Four times a day (QID) | RECTAL | Status: DC | PRN

## 2014-10-28 MED ORDER — ONDANSETRON HCL 4 MG/2ML IJ SOLN: 4.0000 mg | Freq: Three times a day (TID) | INTRAMUSCULAR | Status: DC | PRN

## 2014-10-28 MED ORDER — INFLUENZA VAC SPLIT QUAD 0.5 ML IM SUSY
0.5000 mL | PREFILLED_SYRINGE | INTRAMUSCULAR | Status: AC
  Administered 2014-10-29: 0.5 mL via INTRAMUSCULAR
  Filled 2014-10-28: qty 0.5

## 2014-10-28 MED ORDER — IPRATROPIUM BROMIDE 0.02 % IN SOLN
0.5000 mg | RESPIRATORY_TRACT | Status: AC
  Administered 2014-10-28: 0.5 mg via RESPIRATORY_TRACT
  Filled 2014-10-28: qty 2.5

## 2014-10-28 NOTE — ED Notes (Signed)
Respiratory at bedside.

## 2014-10-28 NOTE — ED Notes (Signed)
Breathing tx complete; pt showing less distress breathing. Pt reports feeling much better.

## 2014-10-28 NOTE — ED Notes (Signed)
Pt reports cold x 2 days. Hx of asthma, bronchitis. Current smoker, 1/2 pack per day. Labored breathing noted, audible wheezes.

## 2014-10-28 NOTE — ED Notes (Signed)
MD at bedside. 

## 2014-10-28 NOTE — ED Notes (Signed)
Pt leaving for x-ray.  ?

## 2014-10-28 NOTE — ED Provider Notes (Signed)
CSN: [id number]     Arrival date & time 10/28/14  0716 History   First MD Initiated Contact with Patient 10/28/14 0755     Chief Complaint  Patient presents with  . Shortness of Breath     (Consider location/radiation/quality/duration/timing/severity/associated sxs/prior Treatment) HPI Comments: 48 year old female, daily smoker for 30 years presents with a complaint of coughing and shortness of breath with associated wheezing. This started several days ago, is persistent, gradually worsening, the patient states she was unable to sleep last night because of her difficulty breathing and frequent coughing. She denies orthopnea, paroxysmal nocturnal dyspnea, peripheral edema and has no chest pain. She has no history of heart disease, she does have a diagnosis of bronchitis but has not been using any medications at home.  Patient is a 48 y.o. female presenting with shortness of breath. The history is provided by the patient.  Shortness of Breath   Past Medical History  Diagnosis Date  . Bronchitis    Past Surgical History  Procedure Laterality Date  . Cesarean section     No family history on file. History  Substance Use Topics  . Smoking status: Current Some Day Smoker    Types: Cigarettes  . Smokeless tobacco: Not on file  . Alcohol Use: Yes     Comment: occ   OB History    No data available     Review of Systems  Respiratory: Positive for shortness of breath.   All other systems reviewed and are negative.     Allergies  Review of patient's allergies indicates no known allergies.  Home Medications   Prior to Admission medications   Not on File   BP 109/64 mmHg  Pulse 97  Temp(Src) 98.2 F (36.8 C) (Oral)  Resp 29  Ht 5\' 9"  (1.753 m)  Wt 120 lb (54.432 kg)  BMI 17.71 kg/m2  SpO2 98% Physical Exam  Constitutional: She appears well-developed and well-nourished. No distress.  HENT:  Head: Normocephalic and atraumatic.  Mouth/Throat: Oropharynx is clear and  moist. No oropharyngeal exudate.  Eyes: Conjunctivae and EOM are normal. Pupils are equal, round, and reactive to light. Right eye exhibits no discharge. Left eye exhibits no discharge. No scleral icterus.  Neck: Normal range of motion. Neck supple. No JVD present. No thyromegaly present.  Cardiovascular: Normal rate, regular rhythm, normal heart sounds and intact distal pulses.  Exam reveals no gallop and no friction rub.   No murmur heard. No JVD, no peripheral edema  Pulmonary/Chest: She is in respiratory distress. She has wheezes. She has no rales.  Tachypnea, increased work of breathing, mild accessory muscle use, diffuse expiratory wheezing, speaks in shortened sentences.  Abdominal: Soft. Bowel sounds are normal. She exhibits no distension and no mass. There is no tenderness.  Musculoskeletal: Normal range of motion. She exhibits no edema or tenderness.  Lymphadenopathy:    She has no cervical adenopathy.  Neurological: She is alert. Coordination normal.  Skin: Skin is warm and dry. No rash noted. No erythema.  Psychiatric: She has a normal mood and affect. Her behavior is normal.  Nursing note and vitals reviewed.   ED Course  Procedures (including critical care time) Labs Review Labs Reviewed  CBC - Abnormal; Notable for the following:    MCV 72.5 (*)    MCH 24.4 (*)    RDW 15.7 (*)    All other components within normal limits  BASIC METABOLIC PANEL - Abnormal; Notable for the following:    Glucose, Bld 109 (*)  Anion gap 18 (*)    All other components within normal limits    Imaging Review Dg Chest 2 View (if Patient Has Fever And/or Copd)  10/28/2014   CLINICAL DATA:  Seizure and cough and chills for 2 days  EXAM: CHEST  2 VIEW  COMPARISON:  Radiograph 07/24/2011  FINDINGS: Normal cardiac silhouette. Lungs are hyperinflated with flattened hemidiaphragms. No effusion, infiltrate, pneumothorax. Sigmoid scoliosis of the spine noted.  IMPRESSION: Hyperinflated lungs.  No  acute findings.   Electronically Signed   By: Genevive BiStewart  Edmunds M.D.   On: 10/28/2014 08:23    ED ECG REPORT  I personally interpreted this EKG   Date: 10/28/2014   Rate: 101  Rhythm: sinus tachycardia  QRS Axis: normal  Intervals: normal  ST/T Wave abnormalities: nonspecific T wave changes  Conduction Disutrbances:none  Narrative Interpretation:   Old EKG Reviewed: none available   MDM   Final diagnoses:  Acute bronchitis, unspecified organism    The patient is tachycardic, tachypneic and has diffuse wheezing consistent with acute bronchitis, reactive airway disease, would also consider pneumonia. Much less likely to be congestive heart failure or pulmonary embolism. Start tx with continuous nebulizer therapy, prednisone, x-ray.  On reexamination after continuous nebulizer therapy, the patient has only improved slightly but is still very tachypneic, she will need admission to the hospital for ongoing treatments. X-ray labs unremarkable, discussed with hospitalist Dr. Jomarie LongsJoseph who agrees.   Meds given in ED:  Medications  albuterol (PROVENTIL,VENTOLIN) solution continuous neb (0 mg/hr Nebulization Stopped 10/28/14 1137)  albuterol (PROVENTIL) (2.5 MG/3ML) 0.083% nebulizer solution 5 mg (5 mg Nebulization Given 10/28/14 0732)  predniSONE (DELTASONE) tablet 40 mg (40 mg Oral Given 10/28/14 0748)  ipratropium (ATROVENT) nebulizer solution 0.5 mg (0.5 mg Nebulization Given 10/28/14 0831)    New Prescriptions   No medications on file      Vida RollerBrian D Zurii Hewes, MD 10/28/14 1302

## 2014-10-28 NOTE — ED Notes (Addendum)
Pt states cold x 2 days.  Sob since today.  Audible wheezing noted.

## 2014-10-28 NOTE — ED Notes (Signed)
Meal Tray ordered.  

## 2014-10-28 NOTE — Progress Notes (Signed)
Pt has had 25lbs weight loss since August, will follow up with MD tomorrow, oncoming RN aware. Darrel HooverWilson,Wandy Bossler S

## 2014-10-28 NOTE — H&P (Addendum)
[facility] History and Physical  Cindy Ruaamela M Harrell ZOX:[id number] DOB: 1966/03/28 DOA: 10/28/2014  Referring physician: EDP PCP: No primary care provider on file.   Chief Complaint: shortness of breath  HPI: Cindy Harrell is a 48 y.o. female with PMH of tobacco use disorder for 36years, current smoker has not been formally diagnosed with COPD, presented to the ER with the above complaints, she was her usual state of health until 2days ago. She started experiencing runny nose, sore throat, cold like symptoms 2 days ago, this progressed to wheezing and shortness of breath. Breathing worsened today and hence presented to the ER. In ER noted to be wheezing diffusely with tachycardia and tachypnea and hence [facility] consulted for admission.   Review of Systems: positives bolded Constitutional:  No weight loss, night sweats, Fevers, chills, fatigue.  HEENT:  No headaches, Difficulty swallowing,Tooth/dental problems,Sore throat,  No sneezing, itching, ear ache, nasal congestion, post nasal drip,  Cardio-vascular:  No chest pain, Orthopnea, PND, swelling in lower extremities, anasarca, dizziness, palpitations  GI:  No heartburn, indigestion, abdominal pain, nausea, vomiting, diarrhea, change in bowel habits, loss of appetite  Resp:  shortness of breath with exertion or at rest. No excess mucus, no productive cough,  non-productive cough, No coughing up of blood.No change in color of mucus. wheezing.No chest wall deformity  Skin:  no rash or lesions.  GU:  no dysuria, change in color of urine, no urgency or frequency. No flank pain.  Musculoskeletal:  No joint pain or swelling. No decreased range of motion. No back pain.  Psych:  No change in mood or affect. No depression or anxiety. No memory loss.   Past Medical History  Diagnosis Date  . Bronchitis    Past Surgical History  Procedure Laterality Date  . Cesarean section     Social History:  reports that she has been smoking  Cigarettes.  She has been smoking about 0.00 packs per day. She does not have any smokeless tobacco history on file. She reports that she drinks alcohol. She reports that she does not use illicit drugs.  No Known Allergies  Family history  -mother with diabetes and a neuro-endocrine cancer.  -Patient's father is unknown  Prior to Admission medications   Not on File   Physical Exam: Filed Vitals:   10/28/14 1115 10/28/14 1130 10/28/14 1145 10/28/14 1306  BP: 113/62 116/65 109/64 111/77  Pulse: 98 97 97 92  Temp:      TempSrc:      Resp: 28 23 29 18   Height:      Weight:      SpO2: 96% 96% 98% 97%    Wt Readings from Last 3 Encounters:  10/28/14 54.432 kg (120 lb)    General:  Appears to be in mild distress, able to speak in full sentences, no accessory muscle use Eyes: PERRL, normal lids, irises & conjunctiva ENT: grossly normal hearing, lips & tongue Neck: no LAD, masses or thyromegaly Cardiovascular: RRR, no m/r/g. No LE edema. Respiratory: poor air movement B/L, scattered wheezes Abdomen: soft, ntnd Skin: no rash or induration seen on limited exam Musculoskeletal: grossly normal tone BUE/BLE Psychiatric: grossly normal mood and affect, speech fluent and appropriate Neurologic: grossly non-focal.          Labs on Admission:  Basic Metabolic Panel:  Recent Labs Lab 10/28/14 0744  NA 138  K 3.8  CL 101  CO2 19  GLUCOSE 109*  BUN 9  CREATININE 0.66  CALCIUM 9.2  Liver Function Tests: No results for input(s): AST, ALT, ALKPHOS, BILITOT, PROT, ALBUMIN in the last 168 hours. No results for input(s): LIPASE, AMYLASE in the last 168 hours. No results for input(s): AMMONIA in the last 168 hours. CBC:  Recent Labs Lab 10/28/14 0744  WBC 7.5  HGB 12.2  HCT 36.3  MCV 72.5*  PLT 316   Cardiac Enzymes: No results for input(s): CKTOTAL, CKMB, CKMBINDEX, TROPONINI in the last 168 hours.  BNP (last 3 results) No results for input(s): PROBNP in the last  8760 hours. CBG: No results for input(s): GLUCAP in the last 168 hours.  Radiological Exams on Admission: Dg Chest 2 View (if Patient Has Fever And/or Copd)  10/28/2014   CLINICAL DATA:  Seizure and cough and chills for 2 days  EXAM: CHEST  2 VIEW  COMPARISON:  Radiograph 07/24/2011  FINDINGS: Normal cardiac silhouette. Lungs are hyperinflated with flattened hemidiaphragms. No effusion, infiltrate, pneumothorax. Sigmoid scoliosis of the spine noted.  IMPRESSION: Hyperinflated lungs.  No acute findings.   Electronically Signed   By: Genevive BiStewart  Edmunds M.D.   On: 10/28/2014 08:23    EKG: Independently reviewed. Sinus tachycardia, no acute ST T wave changes  Assessment/Plan    1. COPD exacerbation -start IV solumedrol, duonebs, albuterol PRN -PO levaquin, mucinex -CXR clear  2. TObacco abuse -counseled  3.   Weight loss -check TSh, T4 -needs age appropriate cancer screening, advised to FU with PCP for this  Code Status: Full Code DVT Prophylaxis:lovenox Family Communication: none at bedside Disposition Plan: inpatient  Time spent: 60min  Huntsville Endoscopy CenterJOSEPH,Junelle Hashemi [facility] Pager [phone]

## 2014-10-29 ENCOUNTER — Inpatient Hospital Stay (HOSPITAL_COMMUNITY): Payer: MEDICAID

## 2014-10-29 DIAGNOSIS — E43 Unspecified severe protein-calorie malnutrition: Secondary | ICD-10-CM

## 2014-10-29 LAB — COMPREHENSIVE METABOLIC PANEL
ALBUMIN: 4.2 g/dL (ref 3.5–5.2)
ALT: 27 U/L (ref 0–35)
AST: 34 U/L (ref 0–37)
Alkaline Phosphatase: 74 U/L (ref 39–117)
Anion gap: 19 — ABNORMAL HIGH (ref 5–15)
BUN: 8 mg/dL (ref 6–23)
CO2: 21 mEq/L (ref 19–32)
Calcium: 10.2 mg/dL (ref 8.4–10.5)
Chloride: 100 mEq/L (ref 96–112)
Creatinine, Ser: 0.67 mg/dL (ref 0.50–1.10)
GFR calc Af Amer: >90 mL/min (ref >90)
GFR calc non Af Amer: >90 mL/min (ref >90)
GLUCOSE: 141 mg/dL — AB (ref 70–99)
Potassium: 3.8 mEq/L (ref 3.7–5.3)
SODIUM: 140 meq/L (ref 137–147)
TOTAL PROTEIN: 8.5 g/dL — AB (ref 6.0–8.3)
Total Bilirubin: <0.2 mg/dL — ABNORMAL LOW (ref 0.3–1.2)

## 2014-10-29 LAB — CBC
HCT: 38 % (ref 36.0–46.0)
Hemoglobin: 12.9 g/dL (ref 12.0–15.0)
MCH: 24.4 pg — ABNORMAL LOW (ref 26.0–34.0)
MCHC: 33.9 g/dL (ref 30.0–36.0)
MCV: 71.8 fL — ABNORMAL LOW (ref 78.0–100.0)
Platelets: 342 10*3/uL (ref 150–400)
RBC: 5.29 MIL/uL — ABNORMAL HIGH (ref 3.87–5.11)
RDW: 15.6 % — AB (ref 11.5–15.5)
WBC: 11 10*3/uL — ABNORMAL HIGH (ref 4.0–10.5)

## 2014-10-29 LAB — TSH: TSH: 0.243 u[IU]/mL — AB (ref 0.350–4.500)

## 2014-10-29 LAB — T4, FREE: Free T4: 1.35 ng/dL (ref 0.80–1.80)

## 2014-10-29 MED ORDER — NICOTINE 14 MG/24HR TD PT24
14.0000 mg | MEDICATED_PATCH | Freq: Every day | TRANSDERMAL | Status: DC
  Administered 2014-10-29 – 2014-10-31 (×3): 14 mg via TRANSDERMAL
  Filled 2014-10-29 (×3): qty 1

## 2014-10-29 MED ORDER — ZOLPIDEM TARTRATE 5 MG PO TABS
5.0000 mg | ORAL_TABLET | Freq: Every evening | ORAL | Status: DC | PRN
  Administered 2014-10-30 (×2): 5 mg via ORAL
  Filled 2014-10-29 (×2): qty 1

## 2014-10-29 MED ORDER — ENSURE COMPLETE PO LIQD
237.0000 mL | Freq: Three times a day (TID) | ORAL | Status: DC
  Administered 2014-10-29 – 2014-10-31 (×4): 237 mL via ORAL

## 2014-10-29 MED ORDER — ENOXAPARIN SODIUM 40 MG/0.4ML ~~LOC~~ SOLN
40.0000 mg | SUBCUTANEOUS | Status: DC
  Administered 2014-10-29 – 2014-10-30 (×2): 40 mg via SUBCUTANEOUS
  Filled 2014-10-29 (×3): qty 0.4

## 2014-10-29 NOTE — Progress Notes (Signed)
Utilization review completed.  

## 2014-10-29 NOTE — Plan of Care (Signed)
Problem: Phase I Progression Outcomes Goal: O2 sats > or equal 90% or at baseline Outcome: Completed/Met Date Met:  10/29/14 Goal: Dyspnea controlled at rest Outcome: Completed/Met Date Met:  10/29/14 Goal: Hemodynamically stable Outcome: Completed/Met Date Met:  10/29/14 Goal: Flu/PneumoVaccines if indicated Outcome: Completed/Met Date Met:  10/29/14 Goal: Pain controlled Outcome: Completed/Met Date Met:  10/29/14 Goal: Pt OOB to Walk or Exercise Daily With Nursing or PT Patient OOB to walk or exercise daily with nursing or PT if activity order permits  Outcome: Completed/Met Date Met:  10/29/14 Goal: Discharge plan established Outcome: Completed/Met Date Met:  10/29/14 Goal: Tolerating diet Outcome: Completed/Met Date Met:  10/29/14

## 2014-10-29 NOTE — Progress Notes (Signed)
INITIAL NUTRITION ASSESSMENT  DOCUMENTATION CODES Per approved criteria  -Severe malnutrition in the context of chronic illness -Underweight   INTERVENTION: Ensure Complete po TID, each supplement provides 350 kcal and 13 grams of protein RD to follow for nutrition care plan  NUTRITION DIAGNOSIS: Unintended weight loss related to unknown etiology as evidenced by 18% weight loss 3 months  Goal: Pt to meet >/= 90% of their estimated nutrition needs   Monitor:  PO & supplemental intake, weight, labs, I/O's  Reason for Assessment: Malnutrition Screening Tool Report  48 y.o. female  Admitting Dx: Tobacco abuse  ASSESSMENT: 48 y.o. Female with PMH of tobacco use disorder for 36 years, current smoker has not been formally diagnosed with COPD, presented to the ER with tachypnea.  Patient reports a decreased appetite; states she is a "grazer" and snacks on food throughout the day; she also reports she's been having diarrhea; endorses a 25 lb weight loss (18%) since August 2015 (severe for time frame) -- question etiology -- noted advised to follow up with PCP; amenable to Ensure Complete supplements during hospitalization; RD to order.  Nutrition Focused Physical Exam:  Subcutaneous Fat:  Orbital Region: N/A Upper Arm Region: severe depletion Thoracic and Lumbar Region: N/A  Muscle:  Temple Region: moderate depletion Clavicle Bone Region: severe depletion Clavicle and Acromion Bone Region: severe depletion Scapular Bone Region: N/A Dorsal Hand: N/A Patellar Region: moderate to severe depletion Anterior Thigh Region: moderate to severe depletion Posterior Calf Region: moderate to severe depletion  Edema: none  Patient meets criteria for severe malnutrition in the context of chronic illness as evidenced by 18% weight loss x 3 months, severe subcutaneous fat & muscle loss.  Height: Ht Readings from Last 1 Encounters:  10/28/14 5\' 9"  (1.753 m)    Weight: Wt Readings from  Last 1 Encounters:  10/28/14 111 lb 12.4 oz (50.7 kg)    Ideal Body Weight: 160 lb  % Ideal Body Weight: 69%  Wt Readings from Last 10 Encounters:  10/28/14 111 lb 12.4 oz (50.7 kg)    Usual Body Weight: 136 lb -- August 2015  % Usual Body Weight: 82%  BMI:  Body mass index is 16.5 kg/(m^2).  Estimated Nutritional Needs: Kcal: 1700-1900 Protein: 80-90 gm Fluid: 1.7-1.9 L  Skin: Intact  Diet Order: Heart Healthy  EDUCATION NEEDS: -No education needs identified at this time  Labs:   Recent Labs Lab 10/28/14 0744 10/29/14 0500  NA 138 140  K 3.8 3.8  CL 101 100  CO2 19 21  BUN 9 8  CREATININE 0.66 0.67  CALCIUM 9.2 10.2  GLUCOSE 109* 141*    Scheduled Meds: . enoxaparin (LOVENOX) injection  40 mg Subcutaneous Q24H  . guaiFENesin  600 mg Oral BID  . ipratropium-albuterol  3 mL Nebulization Q4H  . levofloxacin  500 mg Oral Q24H  . methylPREDNISolone (SOLU-MEDROL) injection  80 mg Intravenous Q6H  . nicotine  14 mg Transdermal Daily    Continuous Infusions:   Past Medical History  Diagnosis Date  . Bronchitis   . Shortness of breath dyspnea 10/28/2014  . Depression   . Meningitis spinal     AS CHILD     Past Surgical History  Procedure Laterality Date  . Cesarean section      Maureen ChattersKatie Billal Rollo, RD, LDN Pager #: [phone] After-Hours Pager #: [phone]

## 2014-10-29 NOTE — Progress Notes (Signed)
[facility] PROGRESS NOTE  Lavonna Ruaamela M Kreager ZOX:[id number] DOB: 09/16/1966 DOA: 10/28/2014 PCP: No primary care provider on file.   brief narrative 48 year old female with history of COPD (actively smoking and not on any medications) , presented with shortness of breath and nonproductive cough following URI symptoms today's back. Patient was progressively wheezy. In the ED she was diffusely wheezy with tachycardia and tachypnea and was admitted for COPD exacerbation.   Assessment/Plan: Acute COPD exacerbation Patient was admitted back in 2012-2013 for COPD exacerbation and was prescribed albuterol and Advair but has not used it for a long time. Does have improved since admission but still has bilateral wheezing and cough. Denies hemoptysis. -Continue IV Solu-Medrol, scheduled DuoNeb's and when necessary albuterol inhaler. Will need medications upon discharge. -Continue oral Levaquin and Mucinex. -Patient does not have PCP in the community. Ask case manager to provide a list of PCP in the community. She also needs referral to pulmonary and have outpatient PFTs. -Counseled on smoking cessation. Order nicotine patch  Significant weight loss Reports almost 20-25 pounds weight loss over past few months. Reports chronic cough. She has not had any age appropriate cancer screening done. Will obtain low dose helical CT of the chest to rule out any lung mass. TSH suppressed , normal Free T4. Check HIV ab. Chest x-ray unremarkable. Needs outpatient evaluation. -nutrition consult.  Tobacco abuse Counseled strongly on smoking cessation  Severe protein calorie malnutrition Added nutrition supplements  DVT prophylaxis: Subcutaneous Lovenox  Diet: Regular  Code Status: Full code Family Communication: None at bedside Disposition Plan: Home possibly 1-2 days if clinically improved   Consultants:  None  Procedures:  None  Antibiotics:  Levaquin since  11/19  HPI/Subjective: Patient reports her breathing to be much better but still has wheezing and nonproductive cough  Objective: Filed Vitals:   10/29/14 0632  BP: 118/79  Pulse: 93  Temp: 98.4 F (36.9 C)  Resp: 20   No intake or output data in the 24 hours ending 10/29/14 1224 Filed Weights   10/28/14 0722 10/28/14 1543  Weight: 54.432 kg (120 lb) 50.7 kg (111 lb 12.4 oz)    Exam:   General:  Middle aged thin built female in no acute distress  HEENT: No pallor, moist oral mucosa  Chest: Scattered wheezing bilaterally, no crackles  Cardiovascular: Normal S1 and S2, no murmurs  Abdomen: Soft, nontender, nondistended, bowel sounds present  Extremities: Warm, no edema    Data Reviewed: Basic Metabolic Panel:  Recent Labs Lab 10/28/14 0744 10/29/14 0500  NA 138 140  K 3.8 3.8  CL 101 100  CO2 19 21  GLUCOSE 109* 141*  BUN 9 8  CREATININE 0.66 0.67  CALCIUM 9.2 10.2   Liver Function Tests:  Recent Labs Lab 10/29/14 0500  AST 34  ALT 27  ALKPHOS 74  BILITOT <0.2*  PROT 8.5*  ALBUMIN 4.2   No results for input(s): LIPASE, AMYLASE in the last 168 hours. No results for input(s): AMMONIA in the last 168 hours. CBC:  Recent Labs Lab 10/28/14 0744 10/29/14 0500  WBC 7.5 11.0*  HGB 12.2 12.9  HCT 36.3 38.0  MCV 72.5* 71.8*  PLT 316 342   Cardiac Enzymes: No results for input(s): CKTOTAL, CKMB, CKMBINDEX, TROPONINI in the last 168 hours. BNP (last 3 results) No results for input(s): PROBNP in the last 8760 hours. CBG: No results for input(s): GLUCAP in the last 168 hours.  No results found for this or any previous visit (from the  past 240 hour(s)).   Studies: Dg Chest 2 View (if Patient Has Fever And/or Copd)  10/28/2014   CLINICAL DATA:  Seizure and cough and chills for 2 days  EXAM: CHEST  2 VIEW  COMPARISON:  Radiograph 07/24/2011  FINDINGS: Normal cardiac silhouette. Lungs are hyperinflated with flattened hemidiaphragms. No effusion,  infiltrate, pneumothorax. Sigmoid scoliosis of the spine noted.  IMPRESSION: Hyperinflated lungs.  No acute findings.   Electronically Signed   By: Genevive BiStewart  Edmunds M.D.   On: 10/28/2014 08:23    Scheduled Meds: . enoxaparin (LOVENOX) injection  40 mg Subcutaneous Q24H  . feeding supplement (ENSURE COMPLETE)  237 mL Oral TID BM  . guaiFENesin  600 mg Oral BID  . ipratropium-albuterol  3 mL Nebulization Q4H  . levofloxacin  500 mg Oral Q24H  . methylPREDNISolone (SOLU-MEDROL) injection  80 mg Intravenous Q6H  . nicotine  14 mg Transdermal Daily   Continuous Infusions:     Time spent: 25 minutes    Kaily Wragg  [facility] Pager [phone]. If 7PM-7AM, please contact night-coverage at [email or web address], password [email or web address] 10/29/2014, 12:24 PM  LOS: 1 day

## 2014-10-30 DIAGNOSIS — J208 Acute bronchitis due to other specified organisms: Secondary | ICD-10-CM

## 2014-10-30 LAB — HIV ANTIBODY (ROUTINE TESTING W REFLEX): HIV: NONREACTIVE

## 2014-10-30 MED ORDER — PREDNISONE 50 MG PO TABS
60.0000 mg | ORAL_TABLET | Freq: Every day | ORAL | Status: DC
  Administered 2014-10-30 – 2014-10-31 (×2): 60 mg via ORAL
  Filled 2014-10-30 (×3): qty 1

## 2014-10-30 MED ORDER — IPRATROPIUM-ALBUTEROL 0.5-2.5 (3) MG/3ML IN SOLN
3.0000 mL | Freq: Four times a day (QID) | RESPIRATORY_TRACT | Status: DC
  Administered 2014-10-30 – 2014-10-31 (×4): 3 mL via RESPIRATORY_TRACT
  Filled 2014-10-30 (×5): qty 3

## 2014-10-30 NOTE — Progress Notes (Signed)
[facility] PROGRESS NOTE  Cindy Harrell M [place] ZOX:[id number] DOB: [birth year] DOA: 10/28/2014 PCP: No primary care provider on file.   brief narrative 48 year old female with history of COPD (actively smoking and not on any medications) , presented with shortness of breath and nonproductive cough following URI symptoms today's back. Patient was progressively wheezy. In the ED she was diffusely wheezy with tachycardia and tachypnea and was admitted for COPD exacerbation.   Assessment/Plan: Acute COPD exacerbation Patient was admitted back in 2012-2013 for COPD exacerbation and was prescribed albuterol and Advair but has not used it for a long time. Does have improved since admission but still has bilateral wheezing and cough. Denies hemoptysis. -Patient reporting feeling better today although continues to have cough and shortness of breath stating she is not quite at her baseline. -Will transition to prednisone 60 mg by mouth daily, continue nebs and Levaquin 500 mg by mouth daily  Significant weight loss Reports almost 20-25 pounds weight loss over past few months. Reports chronic cough. She has not had any age appropriate cancer screening done. -CT scan of lungs do not reveal acute cardiopulmonary disease, there is no evidence of lung mass. Radiology reporting several small bilateral peripheral pulmonary nodules unchanged compared to 2008 and therefore likely benign. No new nodules identified. -TSH suppressed , normal Free T4. Check HIV ab. Chest x-ray unremarkable. Needs outpatient follow-up.  Tobacco abuse Counseled strongly on smoking cessation  Severe protein calorie malnutrition Added nutrition supplements  DVT prophylaxis: Subcutaneous Lovenox  Diet: Regular  Code Status: Full code Family Communication: None at bedside Disposition Plan: Anticipate discharge in the next 24 hours   Consultants:  None  Procedures:  None  Antibiotics:  Levaquin since  11/19  HPI/Subjective: She reports feeling better today although not quite at her baseline, continues to have cough, shortness of breath and wheezing. She is tolerating by mouth intake.  Objective: Filed Vitals:   10/30/14 0439  BP: 110/68  Pulse: 121  Temp: 98.2 F (36.8 C)  Resp: 16    Intake/Output Summary (Last 24 hours) at 10/30/14 1248 Last data filed at 10/30/14 0943  Gross per 24 hour  Intake    720 ml  Output      0 ml  Net    720 ml   Filed Weights   10/28/14 0722 10/28/14 1543  Weight: 54.432 kg (120 lb) 50.7 kg (111 lb 12.4 oz)    Exam:   General:  Middle aged thin built female in no acute distress  HEENT: No pallor, moist oral mucosa  Chest: Scattered wheezing bilaterally, no crackles, diminished breath sounds bilaterally  Cardiovascular: Normal S1 and S2, no murmurs  Abdomen: Soft, nontender, nondistended, bowel sounds present  Extremities: Warm, no edema   Data Reviewed: Basic Metabolic Panel:  Recent Labs Lab 10/28/14 0744 10/29/14 0500  NA 138 140  K 3.8 3.8  CL 101 100  CO2 19 21  GLUCOSE 109* 141*  BUN 9 8  CREATININE 0.66 0.67  CALCIUM 9.2 10.2   Liver Function Tests:  Recent Labs Lab 10/29/14 0500  AST 34  ALT 27  ALKPHOS 74  BILITOT <0.2*  PROT 8.5*  ALBUMIN 4.2   No results for input(s): LIPASE, AMYLASE in the last 168 hours. No results for input(s): AMMONIA in the last 168 hours. CBC:  Recent Labs Lab 10/28/14 0744 10/29/14 0500  WBC 7.5 11.0*  HGB 12.2 12.9  HCT 36.3 38.0  MCV 72.5* 71.8*  PLT 316 342   Cardiac Enzymes:  No results for input(s): CKTOTAL, CKMB, CKMBINDEX, TROPONINI in the last 168 hours. BNP (last 3 results) No results for input(s): PROBNP in the last 8760 hours. CBG: No results for input(s): GLUCAP in the last 168 hours.  No results found for this or any previous visit (from the past 240 hour(s)).   Studies: Ct Chest Low Dose Screening W/o Cm  10/29/2014   CLINICAL DATA:   Screening for lung mass Pt w/ severe cough, acute bronchitis Per chart- hx of COPD exacerbation, tobacco use- pt smokes pack a day for over 30 yrs Per referring MD- specifically wanted CT chest low dose  EXAM: CT CHEST WITHOUT CONTRAST  TECHNIQUE: Multidetector CT imaging of the chest was performed following the standard protocol without intravenous contrast. High resolution imaging of the lungs, as well as inspiratory and expiratory imaging, was performed.  COMPARISON:  CT 01/16/2007 and chest x-ray 10/28/2014.  FINDINGS: Lungs are well inflated without consolidation or effusion. There are a few small bilateral subpleural nodules which are overall unchanged to slightly smaller compared to the previous exam from 2008. No new nodules identified. Airways are within normal. Heart is normal in size. Of mediastinal hilar adenopathy. Remaining mediastinal structures are unremarkable. Images through the upper abdomen are unremarkable. Remaining bones soft tissues are within normal.  IMPRESSION: No acute cardiopulmonary disease.  No evidence of lung mass.  Several small bilateral peripheral pulmonary nodules overall unchanged to slightly smaller compared to 2008 and therefore benign. No new nodules identified.   Electronically Signed   By: Elberta Fortisaniel  Boyle M.D.   On: 10/29/2014 19:29    Scheduled Meds: . enoxaparin (LOVENOX) injection  40 mg Subcutaneous Q24H  . feeding supplement (ENSURE COMPLETE)  237 mL Oral TID BM  . guaiFENesin  600 mg Oral BID  . ipratropium-albuterol  3 mL Nebulization Q6H  . levofloxacin  500 mg Oral Q24H  . nicotine  14 mg Transdermal Daily  . predniSONE  60 mg Oral Q breakfast   Continuous Infusions:     Time spent: 25 minutes    Jeralyn BennettZAMORA, Rhyse Loux  [facility] Pager [phone]. If 7PM-7AM, please contact night-coverage at [email or web address], password [email or web address] 10/30/2014, 12:48 PM  LOS: 2 days

## 2014-10-31 MED ORDER — LEVOFLOXACIN 500 MG PO TABS: 500.0000 mg | ORAL_TABLET | ORAL | Status: DC

## 2014-10-31 MED ORDER — PREDNISONE (PAK) 10 MG PO TABS: ORAL_TABLET | ORAL | Status: DC

## 2014-10-31 MED ORDER — GUAIFENESIN ER 600 MG PO TB12: 600.0000 mg | ORAL_TABLET | Freq: Two times a day (BID) | ORAL | Status: DC

## 2014-10-31 MED ORDER — IPRATROPIUM-ALBUTEROL 0.5-2.5 (3) MG/3ML IN SOLN: 3.0000 mL | RESPIRATORY_TRACT | Status: DC | PRN

## 2014-10-31 NOTE — Discharge Summary (Addendum)
Physician Discharge Summary  Cindy Harrell XBJ:[id number] DOB: Aug 06, 1966 DOA: 10/28/2014  PCP: No primary care provider on file.  Admit date: 10/28/2014 Discharge date: 10/31/2014  Time spent: 35 minutes  Recommendations for Outpatient Follow-up:  1. Please follow-up on patient's respiratory status, was admitted for COPD exacerbation 2. Please follow-up on patient's weight loss, workup during this hospitalization unremarkable. Repeat thyroid function tests       in the outpatient setting  Discharge Diagnoses:  Principal Problem:   COPD exacerbation Active Problems:   Acute bronchitis   Tobacco abuse   Weight loss   Protein-calorie malnutrition, severe   Discharge Condition: Stable/improved  Diet recommendation: Heart healthy diet  Filed Weights   10/28/14 0722 10/28/14 1543  Weight: 54.432 kg (120 lb) 50.7 kg (111 lb 12.4 oz)    History of present illness:  Cindy Harrell is a 48 y.o. female with PMH of tobacco use disorder for 36years, current smoker has not been formally diagnosed with COPD, presented to the ER with the above complaints, she was her usual state of health until 2days ago. She started experiencing runny nose, sore throat, cold like symptoms 2 days ago, this progressed to wheezing and shortness of breath. Breathing worsened today and hence presented to the ER. In ER noted to be wheezing diffusely with tachycardia and tachypnea and hence [facility] consulted for admission.  Hospital Course:  Patient is a pleasant 48 year old female with a past medical history of tobacco abuse, COPD was admitted to the medicine service on 10/28/2014 presenting with complaints of shortness of breath, cough, sputum production. Initial workup included a chest x-ray that showed hyperinflated lungs consistent with COPD without acute cardiopulmonary process. Symptoms attributed to COPD exacerbation as she was started on IV steroids, empiric antibiotic therapy, administered nebulizer  treatments. Patient showed gradual clinical improvement over the course of her hospitalization. On 10/30/2014 her IV steroids were changed to oral prednisone, and by 10/31/2014 she was close to her baseline. Other issues addressed during this hospitalization include weight loss. Patient had reported 25 pound weight loss. Given her tobacco history she had a CT scan of lungs which did not reveal evidence of lung mass. HIV negative. She had a TSH of 0.243 with a free T4 of 1.35.  Would recommend repeating thyroid function tests in the outpatient setting. Otherwise from a pulmonary standpoint she was ambulated down the hall, did not require supplemental oxygen, tolerating by mouth intake. She was discharged to home in stable condition on 10/31/2014.   Discharge Exam: Filed Vitals:   10/31/14 0500  BP: 160/88  Pulse: 109  Temp: 98 F (36.7 C)  Resp: 18    General: No acute distress, awake alert oriented, pleasant, looks well overall Cardiovascular: Regular rate and rhythm normal S1-S2 no murmurs rubs or gallops Respiratory: Improved lung exam, good air movement, decreased wheezing, no crackles or rales Abdomen: Soft nontender nondistended Extremities: No edema  Discharge Instructions You were cared for by a hospitalist during your hospital stay. If you have any questions about your discharge medications or the care you received while you were in the hospital after you are discharged, you can call the unit and asked to speak with the hospitalist on call if the hospitalist that took care of you is not available. Once you are discharged, your primary care physician will handle any further medical issues. Please note that NO REFILLS for any discharge medications will be authorized once you are discharged, as it is imperative that you return to  your primary care physician (or establish a relationship with a primary care physician if you do not have one) for your aftercare needs so that they can reassess  your need for medications and monitor your lab values.  Discharge Instructions    Call MD for:  difficulty breathing, headache or visual disturbances    Complete by:  As directed      Call MD for:  extreme fatigue    Complete by:  As directed      Call MD for:  hives    Complete by:  As directed      Call MD for:  persistant dizziness or light-headedness    Complete by:  As directed      Call MD for:  persistant nausea and vomiting    Complete by:  As directed      Call MD for:  redness, tenderness, or signs of infection (pain, swelling, redness, odor or green/yellow discharge around incision site)    Complete by:  As directed      Call MD for:  severe uncontrolled pain    Complete by:  As directed      Call MD for:  temperature >100.4    Complete by:  As directed      Diet - low sodium heart healthy    Complete by:  As directed      Increase activity slowly    Complete by:  As directed           Current Discharge Medication List    START taking these medications   Details  guaiFENesin (MUCINEX) 600 MG 12 hr tablet Take 1 tablet (600 mg total) by mouth 2 (two) times daily. Qty: 30 tablet, Refills: 0    ipratropium-albuterol (DUONEB) 0.5-2.5 (3) MG/3ML SOLN Take 3 mLs by nebulization every 4 (four) hours as needed. Qty: 360 mL, Refills: 1    levofloxacin (LEVAQUIN) 500 MG tablet Take 1 tablet (500 mg total) by mouth daily. Qty: 2 tablet, Refills: 0    predniSONE (STERAPRED UNI-PAK) 10 MG tablet Take 6-5-4-3-2-1 tablets by mouth daily till gone. Qty: 21 tablet, Refills: 0      CONTINUE these medications which have NOT CHANGED   Details  IRON PO Take 1 tablet by mouth daily.    Multiple Vitamin (MULTIVITAMIN WITH MINERALS) TABS tablet Take 1 tablet by mouth daily.      STOP taking these medications     Phenylephrine-DM-GG-APAP (MUCINEX FAST-MAX SEVERE COLD) 5-10-200-325 MG/10ML LIQD        No Known Allergies    The results of significant diagnostics from this  hospitalization (including imaging, microbiology, ancillary and laboratory) are listed below for reference.    Significant Diagnostic Studies: Dg Chest 2 View (if Patient Has Fever And/or Copd)  10/28/2014   CLINICAL DATA:  Seizure and cough and chills for 2 days  EXAM: CHEST  2 VIEW  COMPARISON:  Radiograph 07/24/2011  FINDINGS: Normal cardiac silhouette. Lungs are hyperinflated with flattened hemidiaphragms. No effusion, infiltrate, pneumothorax. Sigmoid scoliosis of the spine noted.  IMPRESSION: Hyperinflated lungs.  No acute findings.   Electronically Signed   By: Genevive BiStewart  Edmunds M.D.   On: 10/28/2014 08:23   Ct Chest Low Dose Screening W/o Cm  10/29/2014   CLINICAL DATA:  Screening for lung mass Pt w/ severe cough, acute bronchitis Per chart- hx of COPD exacerbation, tobacco use- pt smokes pack a day for over 30 yrs Per referring MD- specifically wanted CT chest low dose  EXAM: CT CHEST WITHOUT CONTRAST  TECHNIQUE: Multidetector CT imaging of the chest was performed following the standard protocol without intravenous contrast. High resolution imaging of the lungs, as well as inspiratory and expiratory imaging, was performed.  COMPARISON:  CT 01/16/2007 and chest x-ray 10/28/2014.  FINDINGS: Lungs are well inflated without consolidation or effusion. There are a few small bilateral subpleural nodules which are overall unchanged to slightly smaller compared to the previous exam from 2008. No new nodules identified. Airways are within normal. Heart is normal in size. Of mediastinal hilar adenopathy. Remaining mediastinal structures are unremarkable. Images through the upper abdomen are unremarkable. Remaining bones soft tissues are within normal.  IMPRESSION: No acute cardiopulmonary disease.  No evidence of lung mass.  Several small bilateral peripheral pulmonary nodules overall unchanged to slightly smaller compared to 2008 and therefore benign. No new nodules identified.   Electronically Signed   By:  Elberta Fortisaniel  Boyle M.D.   On: 10/29/2014 19:29    Microbiology: No results found for this or any previous visit (from the past 240 hour(s)).   Labs: Basic Metabolic Panel:  Recent Labs Lab 10/28/14 0744 10/29/14 0500  NA 138 140  K 3.8 3.8  CL 101 100  CO2 19 21  GLUCOSE 109* 141*  BUN 9 8  CREATININE 0.66 0.67  CALCIUM 9.2 10.2   Liver Function Tests:  Recent Labs Lab 10/29/14 0500  AST 34  ALT 27  ALKPHOS 74  BILITOT <0.2*  PROT 8.5*  ALBUMIN 4.2   No results for input(s): LIPASE, AMYLASE in the last 168 hours. No results for input(s): AMMONIA in the last 168 hours. CBC:  Recent Labs Lab 10/28/14 0744 10/29/14 0500  WBC 7.5 11.0*  HGB 12.2 12.9  HCT 36.3 38.0  MCV 72.5* 71.8*  PLT 316 342   Cardiac Enzymes: No results for input(s): CKTOTAL, CKMB, CKMBINDEX, TROPONINI in the last 168 hours. BNP: BNP (last 3 results) No results for input(s): PROBNP in the last 8760 hours. CBG: No results for input(s): GLUCAP in the last 168 hours.     SignedJeralyn Bennett:  Ana Woodroof  [facility] 10/31/2014, 8:33 AM

## 2014-10-31 NOTE — Care Management Note (Signed)
    Page 1 of 1   10/31/2014     12:49:10 PM CARE MANAGEMENT NOTE 10/31/2014  Patient:  Cindy Harrell, Cindy Harrell   Account Number:  [id number]  Date Initiated:  10/31/2014  Documentation initiated by:  [facility]  Subjective/Objective Assessment:   adm: bronchitis/asthma     Action/Plan:   discharge planning   Anticipated DC Date:  10/31/2014   Anticipated DC Plan:  [place]  CM consult      Choice offered to / List presented to:     DME arranged  [place]      DME agency  [place].        Status of service:  Completed, signed off Medicare Important Message given?   (If response is "NO", the following Medicare IM given date fields will be blank) Date Medicare IM given:   Medicare IM given by:   Date Additional Medicare IM given:   Additional Medicare IM given by:    Discharge Disposition:  HOME/SELF CARE  Per UR Regulation:    If discussed at Long Length of Stay Meetings, dates discussed:    Comments:  10/31/14 CM met with pt in room and gave pt [place] letter with list of participating pharmacies.  Pt verbalizes understanding of all MATCH parameters.  CM also gave pt [place] pamphlet and pt verbzlized understanding sh is to go to the clinic any weekday morning from 9-10am and ask for: AN APPOINTMENT FOR A PCP; AN APPOINTMENT WITH A NAVIGATOR FOR INSURANCE; AND AN APPOINTMENT FOR FOLLOW UP CARE.  Daughter is in room to take pt home and CM calls [facility] DME rep, Germaine toplease deliver nebulizer machine and rolling walker to room.  No other CM needs were communicated.  Mariane Masters, BSN, CM [phone].

## 2014-11-03 ENCOUNTER — Encounter: Payer: Self-pay | Admitting: Internal Medicine

## 2014-11-03 ENCOUNTER — Ambulatory Visit: Payer: Self-pay | Attending: Internal Medicine | Admitting: Internal Medicine

## 2014-11-03 VITALS — BP 128/73 | HR 70 | Temp 98.4°F | Resp 16 | Ht 69.0 in | Wt 118.0 lb

## 2014-11-03 DIAGNOSIS — Z720 Tobacco use: Secondary | ICD-10-CM

## 2014-11-03 DIAGNOSIS — F17210 Nicotine dependence, cigarettes, uncomplicated: Secondary | ICD-10-CM | POA: Insufficient documentation

## 2014-11-03 DIAGNOSIS — Z23 Encounter for immunization: Secondary | ICD-10-CM

## 2014-11-03 DIAGNOSIS — Z8661 Personal history of infections of the central nervous system: Secondary | ICD-10-CM | POA: Insufficient documentation

## 2014-11-03 DIAGNOSIS — J441 Chronic obstructive pulmonary disease with (acute) exacerbation: Secondary | ICD-10-CM | POA: Insufficient documentation

## 2014-11-03 DIAGNOSIS — R946 Abnormal results of thyroid function studies: Secondary | ICD-10-CM | POA: Insufficient documentation

## 2014-11-03 DIAGNOSIS — R7989 Other specified abnormal findings of blood chemistry: Secondary | ICD-10-CM

## 2014-11-03 DIAGNOSIS — F17200 Nicotine dependence, unspecified, uncomplicated: Secondary | ICD-10-CM

## 2014-11-03 DIAGNOSIS — Z8349 Family history of other endocrine, nutritional and metabolic diseases: Secondary | ICD-10-CM | POA: Insufficient documentation

## 2014-11-03 MED ORDER — FLUTICASONE-SALMETEROL 100-50 MCG/DOSE IN AEPB: 1.0000 | INHALATION_SPRAY | Freq: Two times a day (BID) | RESPIRATORY_TRACT | Status: DC

## 2014-11-03 NOTE — Patient Instructions (Signed)
Smoking Cessation Quitting smoking is important to your health and has many advantages. However, it is not always easy to quit since nicotine is a very addictive drug. Oftentimes, people try 3 times or more before being able to quit. This document explains the best ways for you to prepare to quit smoking. Quitting takes hard work and a lot of effort, but you can do it. ADVANTAGES OF QUITTING SMOKING  You will live longer, feel better, and live better.  Your body will feel the impact of quitting smoking almost immediately.  Within 20 minutes, blood pressure decreases. Your pulse returns to its normal level.  After 8 hours, carbon monoxide levels in the blood return to normal. Your oxygen level increases.  After 24 hours, the chance of having a heart attack starts to decrease. Your breath, hair, and body stop smelling like smoke.  After 48 hours, damaged nerve endings begin to recover. Your sense of taste and smell improve.  After 72 hours, the body is virtually free of nicotine. Your bronchial tubes relax and breathing becomes easier.  After 2 to 12 weeks, lungs can hold more air. Exercise becomes easier and circulation improves.  The risk of having a heart attack, stroke, cancer, or lung disease is greatly reduced.  After 1 year, the risk of coronary heart disease is cut in half.  After 5 years, the risk of stroke falls to the same as a nonsmoker.  After 10 years, the risk of lung cancer is cut in half and the risk of other cancers decreases significantly.  After 15 years, the risk of coronary heart disease drops, usually to the level of a nonsmoker.  If you are pregnant, quitting smoking will improve your chances of having a healthy baby.  The people you live with, especially any children, will be healthier.  You will have extra money to spend on things other than cigarettes. QUESTIONS TO THINK ABOUT BEFORE ATTEMPTING TO QUIT You may want to talk about your answers with your  health care provider.  Why do you want to quit?  If you tried to quit in the past, what helped and what did not?  What will be the most difficult situations for you after you quit? How will you plan to handle them?  Who can help you through the tough times? Your family? Friends? A health care provider?  What pleasures do you get from smoking? What ways can you still get pleasure if you quit? Here are some questions to ask your health care provider:  How can you help me to be successful at quitting?  What medicine do you think would be best for me and how should I take it?  What should I do if I need more help?  What is smoking withdrawal like? How can I get information on withdrawal? GET READY  Set a quit date.  Change your environment by getting rid of all cigarettes, ashtrays, matches, and lighters in your home, car, or work. Do not let people smoke in your home.  Review your past attempts to quit. Think about what worked and what did not. GET SUPPORT AND ENCOURAGEMENT You have a better chance of being successful if you have help. You can get support in many ways.  Tell your family, friends, and coworkers that you are going to quit and need their support. Ask them not to smoke around you.  Get individual, group, or telephone counseling and support. Programs are available at local hospitals and health centers. Call   your local health department for information about programs in your area.  Spiritual beliefs and practices may help some smokers quit.  Download a "quit meter" on your computer to keep track of quit statistics, such as how long you have gone without smoking, cigarettes not smoked, and money saved.  Get a self-help book about quitting smoking and staying off tobacco. LEARN NEW SKILLS AND BEHAVIORS  Distract yourself from urges to smoke. Talk to someone, go for a walk, or occupy your time with a task.  Change your normal routine. Take a different route to work.  Drink tea instead of coffee. Eat breakfast in a different place.  Reduce your stress. Take a hot bath, exercise, or read a book.  Plan something enjoyable to do every day. Reward yourself for not smoking.  Explore interactive web-based programs that specialize in helping you quit. GET MEDICINE AND USE IT CORRECTLY Medicines can help you stop smoking and decrease the urge to smoke. Combining medicine with the above behavioral methods and support can greatly increase your chances of successfully quitting smoking.  Nicotine replacement therapy helps deliver nicotine to your body without the negative effects and risks of smoking. Nicotine replacement therapy includes nicotine gum, lozenges, inhalers, nasal sprays, and skin patches. Some may be available over-the-counter and others require a prescription.  Antidepressant medicine helps people abstain from smoking, but how this works is unknown. This medicine is available by prescription.  Nicotinic receptor partial agonist medicine simulates the effect of nicotine in your brain. This medicine is available by prescription. Ask your health care provider for advice about which medicines to use and how to use them based on your health history. Your health care provider will tell you what side effects to look out for if you choose to be on a medicine or therapy. Carefully read the information on the package. Do not use any other product containing nicotine while using a nicotine replacement product.  RELAPSE OR DIFFICULT SITUATIONS Most relapses occur within the first 3 months after quitting. Do not be discouraged if you start smoking again. Remember, most people try several times before finally quitting. You may have symptoms of withdrawal because your body is used to nicotine. You may crave cigarettes, be irritable, feel very hungry, cough often, get headaches, or have difficulty concentrating. The withdrawal symptoms are only temporary. They are strongest  when you first quit, but they will go away within 10-14 days. To reduce the chances of relapse, try to:  Avoid drinking alcohol. Drinking lowers your chances of successfully quitting.  Reduce the amount of caffeine you consume. Once you quit smoking, the amount of caffeine in your body increases and can give you symptoms, such as a rapid heartbeat, sweating, and anxiety.  Avoid smokers because they can make you want to smoke.  Do not let weight gain distract you. Many smokers will gain weight when they quit, usually less than 10 pounds. Eat a healthy diet and stay active. You can always lose the weight gained after you quit.  Find ways to improve your mood other than smoking. FOR MORE INFORMATION  www.smokefree.gov  Document Released: 11/20/2001 Document Revised: 04/12/2014 Document Reviewed: 03/06/2012 ExitCare Patient Information 2015 [employer]. This information is not intended to replace advice given to you by your health care provider. Make sure you discuss any questions you have with your health care provider. Chronic Obstructive Pulmonary Disease Chronic obstructive pulmonary disease (COPD) is a common lung condition in which airflow from the lungs is limited.   COPD is a general term that can be used to describe many different lung problems that limit airflow, including both chronic bronchitis and emphysema. If you have COPD, your lung function will probably never return to normal, but there are measures you can take to improve lung function and make yourself feel better.  CAUSES   Smoking (common).   Exposure to secondhand smoke.   Genetic problems.  Chronic inflammatory lung diseases or recurrent infections. SYMPTOMS   Shortness of breath, especially with physical activity.   Deep, persistent (chronic) cough with a large amount of thick mucus.   Wheezing.   Rapid breaths (tachypnea).   Gray or bluish discoloration (cyanosis) of the skin, especially in fingers,  toes, or lips.   Fatigue.   Weight loss.   Frequent infections or episodes when breathing symptoms become much worse (exacerbations).   Chest tightness. DIAGNOSIS  Your health care provider will take a medical history and perform a physical examination to make the initial diagnosis. Additional tests for COPD may include:   Lung (pulmonary) function tests.  Chest X-ray.  CT scan.  Blood tests. TREATMENT  Treatment available to help you feel better when you have COPD includes:   Inhaler and nebulizer medicines. These help manage the symptoms of COPD and make your breathing more comfortable.  Supplemental oxygen. Supplemental oxygen is only helpful if you have a low oxygen level in your blood.   Exercise and physical activity. These are beneficial for nearly all people with COPD. Some people may also benefit from a pulmonary rehabilitation program. HOME CARE INSTRUCTIONS   Take all medicines (inhaled or pills) as directed by your health care provider.  Avoid over-the-counter medicines or cough syrups that dry up your airway (such as antihistamines) and slow down the elimination of secretions unless instructed otherwise by your health care provider.   If you are a smoker, the most important thing that you can do is stop smoking. Continuing to smoke will cause further lung damage and breathing trouble. Ask your health care provider for help with quitting smoking. He or she can direct you to community resources or hospitals that provide support.  Avoid exposure to irritants such as smoke, chemicals, and fumes that aggravate your breathing.  Use oxygen therapy and pulmonary rehabilitation if directed by your health care provider. If you require home oxygen therapy, ask your health care provider whether you should purchase a pulse oximeter to measure your oxygen level at home.   Avoid contact with individuals who have a contagious illness.  Avoid extreme temperature and  humidity changes.  Eat healthy foods. Eating smaller, more frequent meals and resting before meals may help you maintain your strength.  Stay active, but balance activity with periods of rest. Exercise and physical activity will help you maintain your ability to do things you want to do.  Preventing infection and hospitalization is very important when you have COPD. Make sure to receive all the vaccines your health care provider recommends, especially the pneumococcal and influenza vaccines. Ask your health care provider whether you need a pneumonia vaccine.  Learn and use relaxation techniques to manage stress.  Learn and use controlled breathing techniques as directed by your health care provider. Controlled breathing techniques include:   Pursed lip breathing. Start by breathing in (inhaling) through your nose for 1 second. Then, purse your lips as if you were going to whistle and breathe out (exhale) through the pursed lips for 2 seconds.   Diaphragmatic breathing. Start by putting   one hand on your abdomen just above your waist. Inhale slowly through your nose. The hand on your abdomen should move out. Then purse your lips and exhale slowly. You should be able to feel the hand on your abdomen moving in as you exhale.   Learn and use controlled coughing to clear mucus from your lungs. Controlled coughing is a series of short, progressive coughs. The steps of controlled coughing are:   Lean your head slightly forward.   Breathe in deeply using diaphragmatic breathing.   Try to hold your breath for 3 seconds.   Keep your mouth slightly open while coughing twice.   Spit any mucus out into a tissue.   Rest and repeat the steps once or twice as needed. SEEK MEDICAL CARE IF:   You are coughing up more mucus than usual.   There is a change in the color or thickness of your mucus.   Your breathing is more labored than usual.   Your breathing is faster than usual.  SEEK  IMMEDIATE MEDICAL CARE IF:   You have shortness of breath while you are resting.   You have shortness of breath that prevents you from:  Being able to talk.   Performing your usual physical activities.   You have chest pain lasting longer than 5 minutes.   Your skin color is more cyanotic than usual.  You measure low oxygen saturations for longer than 5 minutes with a pulse oximeter. MAKE SURE YOU:   Understand these instructions.  Will watch your condition.  Will get help right away if you are not doing well or get worse. Document Released: 09/05/2005 Document Revised: 04/12/2014 Document Reviewed: 07/23/2013 ExitCare Patient Information 2015 [employer]. This information is not intended to replace advice given to you by your health care provider. Make sure you discuss any questions you have with your health care provider.  

## 2014-11-03 NOTE — Progress Notes (Signed)
Patient ID: Cindy Harrell, female   DOB: 1966/02/14, 48 y.o.   MRN: [id number]  [id number]  [id number]  DOB - 1966/02/14  CC:  Chief Complaint  Patient presents with  . Hospitalization Follow-up  . COPD  . Bronchitis       HPI: Cindy Harrell is a 48 y.o. female here today to establish medical care.  Patient was [facility]/19 for a COPD exacerbation. She has a 36 pack year history and was diagnosed with COPD in 2008.  She reports that she has been feeling better since discharge and her appetite has resumed as well.  She has had a 7 pound increase in weight.  She continues to have some SOB with chest discomfort at night, clear mucous production, rhinitis, and wheezing.  She has completed all antibiotics.  She has a family history of thyroid disease.  She was found to have a low TSH five days ago.  Symptoms include palpitations, weight loss, and diarrhea.  She would like to include a history of spinal meningitis when she was 10918 months of age.   No Known Allergies Past Medical History  Diagnosis Date  . Bronchitis   . Shortness of breath dyspnea 10/28/2014  . Depression   . Meningitis spinal     AS CHILD    Current Outpatient Prescriptions on File Prior to Visit  Medication Sig Dispense Refill  . guaiFENesin (MUCINEX) 600 MG 12 hr tablet Take 1 tablet (600 mg total) by mouth 2 (two) times daily. 30 tablet 0  . ipratropium-albuterol (DUONEB) 0.5-2.5 (3) MG/3ML SOLN Take 3 mLs by nebulization every 4 (four) hours as needed. 360 mL 1  . IRON PO Take 1 tablet by mouth daily.    . Multiple Vitamin (MULTIVITAMIN WITH MINERALS) TABS tablet Take 1 tablet by mouth daily.    . predniSONE (STERAPRED UNI-PAK) 10 MG tablet Take 6-5-4-3-2-1 tablets by mouth daily till gone. 21 tablet 0  . levofloxacin (LEVAQUIN) 500 MG tablet Take 1 tablet (500 mg total) by mouth daily. (Patient not taking: Reported on 11/03/2014) 2 tablet 0   No current facility-administered  medications on file prior to visit.   History reviewed. No pertinent family history. History   Social History  . Marital Status: Single    Spouse Name: N/A    Number of Children: N/A  . Years of Education: N/A   Occupational History  . Not on file.   Social History Main Topics  . Smoking status: Current Some Day Smoker -- 0.25 packs/day for 36 years    Types: Cigarettes  . Smokeless tobacco: Never Used     Comment: states she cut back to 3 cigarettes/day  . Alcohol Use: 0.0 oz/week    0 Not specified per week     Comment: occ  . Drug Use: Yes    Special: Marijuana     Comment: occ  . Sexual Activity: Not on file   Other Topics Concern  . Not on file   Social History Narrative    Review of Systems: See HPI.    Objective:   Filed Vitals:   11/03/14 1003  BP: 128/73  Pulse: 70  Temp: 98.4 F (36.9 C)  Resp: 16    Physical Exam: Constitutional: Patient appears well-developed and well-nourished. No distress. HENT: Normocephalic, atraumatic, External right and left ear normal. Oropharynx is clear and moist.  Eyes: Conjunctivae and EOM are normal. PERRLA, no scleral icterus. Neck: Normal ROM. Neck supple. No JVD.  No tracheal deviation. No thyromegaly. CVS: RRR, S1/S2 +, no murmurs, no gallops, no carotid bruit.  Pulmonary: Effort and breath sounds normal, no stridor, rhonchi, wheezes, rales.  Abdominal: Soft. BS +, no distension, tenderness, rebound or guarding.  Musculoskeletal: Normal range of motion. No edema and no tenderness.  Lymphadenopathy: No lymphadenopathy noted, cervical, inguinal or axillary Neuro: Alert. Normal reflexes, muscle tone coordination. No cranial nerve deficit. Skin: Skin is warm and dry. No rash noted. Not diaphoretic. No erythema. No pallor. Psychiatric: Normal mood and affect. Behavior, judgment, thought content normal.  Lab Results  Component Value Date   WBC 11.0* 10/29/2014   HGB 12.9 10/29/2014   HCT 38.0 10/29/2014   MCV  71.8* 10/29/2014   PLT 342 10/29/2014   Lab Results  Component Value Date   CREATININE 0.67 10/29/2014   BUN 8 10/29/2014   NA 140 10/29/2014   K 3.8 10/29/2014   CL 100 10/29/2014   CO2 21 10/29/2014    No results found for: HGBA1C Lipid Panel  No results found for: CHOL, TRIG, HDL, CHOLHDL, VLDL, LDLCALC     Assessment and plan:   Cindy Harrell was seen today for hospitalization follow-up, copd and bronchitis.  Diagnoses and associated orders for this visit:  COPD exacerbation - Begin Fluticasone-Salmeterol (ADVAIR) 100-50 MCG/DOSE AEPB; Inhale 1 puff into the lungs 2 (two) times daily.  Smoker Smoking cessation discussed for 3 minutes, patient is not willing to quit at this time. Will continue to assess on each visit. Discussed increased risk for diseases such as cancer, heart disease, and stroke. Explained to patient that she will have increased COPD exacerbations as long as she continues to smoke.  Explained the long term complications of COPD and smoking.  Abnormal TSH - TSH; Future--Will recheck in 2 weeks to make sure the results are accurate.  Likely patient may have thyroid disease as well.  Other Orders - Pneumococcal polysaccharide vaccine 23-valent greater than or equal to 2yo subcutaneous/IM     Return in about 3 months (around 02/03/2015) for COPD and 2 weeks TSH level-Lab visit.  The patient was given clear instructions to go to ER or return to medical center if symptoms don't improve, worsen or new problems develop. The patient verbalized understanding.   Holland CommonsKECK, VALERIE, NP-C [facility] [phone] 11/03/2014, 11:11 AM

## 2014-11-03 NOTE — Progress Notes (Signed)
HPI: Cindy Harrell is a 48 y.o. female with PMH of tobacco use disorder for 36years, current smoker has not been formally diagnosed with COPD, presented to the ER with the above complaints, she was her usual state of health until 2days ago. She started experiencing runny nose, sore throat, cold like symptoms 2 days ago, this progressed to wheezing and shortness of breath. Breathing worsened today and hence presented to the ER. In ER noted to be wheezing diffusely with tachycardia and tachypnea and hence TRH consulted for admission.   HFU- [facility] exacerbation [facility] 11/19 Feeling better. Breathing has improved Denies chest pain or sob Sats 95-96 % r/a Nicotine patch on.admits to smoking last Wednesday Need FMLA papers completed 3rd day Prednisone dose pack/ completed ATB's

## 2014-11-12 ENCOUNTER — Telehealth: Payer: Self-pay | Admitting: Internal Medicine

## 2014-11-17 ENCOUNTER — Ambulatory Visit: Payer: Self-pay | Attending: Internal Medicine

## 2014-11-17 DIAGNOSIS — R7989 Other specified abnormal findings of blood chemistry: Secondary | ICD-10-CM

## 2014-11-18 LAB — TSH: TSH: 0.688 u[IU]/mL (ref 0.350–4.500)

## 2014-11-23 ENCOUNTER — Telehealth: Payer: Self-pay | Admitting: Internal Medicine

## 2014-11-23 NOTE — Telephone Encounter (Signed)
Patient has come in today to get the results of her last lab appointment; patient can be reached via phone at  [phone]

## 2014-11-24 ENCOUNTER — Telehealth: Payer: Self-pay | Admitting: Emergency Medicine

## 2014-11-24 NOTE — Telephone Encounter (Signed)
-----   Message from Ambrose FinlandValerie A Keck, NP sent at 11/21/2014 10:28 PM EST ----- TSH is normal.  Will continue to monitor

## 2014-11-24 NOTE — Telephone Encounter (Signed)
Left message with lab results

## 2014-11-26 ENCOUNTER — Ambulatory Visit: Payer: Self-pay

## 2015-02-03 ENCOUNTER — Ambulatory Visit: Payer: Self-pay | Admitting: Internal Medicine

## 2015-08-11 ENCOUNTER — Ambulatory Visit (HOSPITAL_COMMUNITY)
Admission: RE | Admit: 2015-08-11 | Discharge: 2015-08-11 | Disposition: A | Discharge status: Home / Self Care | Payer: 59 | Source: Ambulatory Visit | Attending: Internal Medicine | Admitting: Internal Medicine

## 2015-08-11 ENCOUNTER — Encounter: Payer: Self-pay | Admitting: Internal Medicine

## 2015-08-11 ENCOUNTER — Ambulatory Visit (HOSPITAL_BASED_OUTPATIENT_CLINIC_OR_DEPARTMENT_OTHER): Payer: 59 | Admitting: Internal Medicine

## 2015-08-11 VITALS — BP 129/97 | HR 83 | Temp 98.3°F | Resp 16 | Ht 69.0 in | Wt 117.6 lb

## 2015-08-11 DIAGNOSIS — R0602 Shortness of breath: Secondary | ICD-10-CM | POA: Insufficient documentation

## 2015-08-11 DIAGNOSIS — R079 Chest pain, unspecified: Secondary | ICD-10-CM | POA: Insufficient documentation

## 2015-08-11 DIAGNOSIS — J441 Chronic obstructive pulmonary disease with (acute) exacerbation: Secondary | ICD-10-CM | POA: Diagnosis not present

## 2015-08-11 DIAGNOSIS — R634 Abnormal weight loss: Secondary | ICD-10-CM

## 2015-08-11 DIAGNOSIS — R05 Cough: Secondary | ICD-10-CM | POA: Diagnosis not present

## 2015-08-11 DIAGNOSIS — R5383 Other fatigue: Secondary | ICD-10-CM

## 2015-08-11 DIAGNOSIS — Z720 Tobacco use: Secondary | ICD-10-CM

## 2015-08-11 LAB — COMPLETE METABOLIC PANEL WITH GFR
ALT: 27 U/L (ref 6–29)
AST: 31 U/L (ref 10–35)
Albumin: 4.9 g/dL (ref 3.6–5.1)
Alkaline Phosphatase: 62 U/L (ref 33–130)
BUN: 13 mg/dL (ref 7–25)
CALCIUM: 9.8 mg/dL (ref 8.6–10.4)
CHLORIDE: 103 mmol/L (ref 98–110)
CO2: 26 mmol/L (ref 20–31)
Creat: 0.81 mg/dL (ref 0.50–1.05)
GFR, Est African American: >89 mL/min (ref >=60)
GFR, Est Non African American: 84 mL/min (ref >=60)
Glucose, Bld: 87 mg/dL (ref 65–99)
Potassium: 4.6 mmol/L (ref 3.5–5.3)
Sodium: 140 mmol/L (ref 135–146)
Total Bilirubin: 0.4 mg/dL (ref 0.2–1.2)
Total Protein: 7.6 g/dL (ref 6.1–8.1)

## 2015-08-11 LAB — CBC WITH DIFFERENTIAL/PLATELET
BASOS PCT: 1 % (ref 0–1)
Basophils Absolute: 0.1 10*3/uL (ref 0.0–0.1)
EOS ABS: 0.3 10*3/uL (ref 0.0–0.7)
EOS PCT: 3 % (ref 0–5)
HEMATOCRIT: 37.1 % (ref 36.0–46.0)
Hemoglobin: 12.4 g/dL (ref 12.0–15.0)
Lymphocytes Relative: 47 % — ABNORMAL HIGH (ref 12–46)
Lymphs Abs: 4.4 10*3/uL — ABNORMAL HIGH (ref 0.7–4.0)
MCH: 24.6 pg — ABNORMAL LOW (ref 26.0–34.0)
MCHC: 33.4 g/dL (ref 30.0–36.0)
MCV: 73.6 fL — ABNORMAL LOW (ref 78.0–100.0)
MONO ABS: 0.8 10*3/uL (ref 0.1–1.0)
MPV: 8.7 fL (ref 8.6–12.4)
Monocytes Relative: 8 % (ref 3–12)
Neutro Abs: 3.9 10*3/uL (ref 1.7–7.7)
Neutrophils Relative %: 41 % — ABNORMAL LOW (ref 43–77)
PLATELETS: 394 10*3/uL (ref 150–400)
RBC: 5.04 MIL/uL (ref 3.87–5.11)
RDW: 17.1 % — AB (ref 11.5–15.5)
WBC: 9.4 10*3/uL (ref 4.0–10.5)

## 2015-08-11 LAB — TSH: TSH: 1.085 u[IU]/mL (ref 0.350–4.500)

## 2015-08-11 LAB — T4, FREE: FREE T4: 1.26 ng/dL (ref 0.80–1.80)

## 2015-08-11 MED ORDER — DOXYCYCLINE HYCLATE 100 MG PO TABS: 100.0000 mg | ORAL_TABLET | Freq: Two times a day (BID) | ORAL | Status: DC

## 2015-08-11 MED ORDER — PREDNISONE 10 MG PO TABS: 10.0000 mg | ORAL_TABLET | Freq: Every day | ORAL | Status: DC

## 2015-08-11 MED ORDER — GUAIFENESIN ER 600 MG PO TB12
600.0000 mg | ORAL_TABLET | Freq: Two times a day (BID) | ORAL | Status: AC
Start: 2015-08-11 — End: ?

## 2015-08-11 MED ORDER — FLUTICASONE-SALMETEROL 100-50 MCG/DOSE IN AEPB: 1.0000 | INHALATION_SPRAY | Freq: Two times a day (BID) | RESPIRATORY_TRACT | Status: DC

## 2015-08-11 MED ORDER — GUAIFENESIN-CODEINE 100-10 MG/5ML PO SYRP: 5.0000 mL | ORAL_SOLUTION | Freq: Three times a day (TID) | ORAL | Status: AC | PRN

## 2015-08-11 NOTE — Progress Notes (Signed)
Patient complains of cough Decreased energy feeling weak and exhausted Patient states she feels feverish and achy Having pain in her back from coughing Patient feels like she has walking pneumonia

## 2015-08-11 NOTE — Patient Instructions (Addendum)
Please begin Advair inhaler 1 puff twice per day. It will help your cough and breathing Prednisone is a steroid that helps with inflammation. Take 6 pills today and decrease by 1 pill per day. Doxycycline is a antibiotic for the infection Mucinex with loosen mucous and help with cough. Cheratussin is a strong cough syrup. May use at night or when you get off work.   Get the chest xray asap. Pulmonology will call you for a appt.

## 2015-08-11 NOTE — Progress Notes (Signed)
Patient ID: Cindy Harrell, female   DOB: 05/17/1966, 49 y.o.   MRN: [id number]  CC: Cough  HPI:  Cindy Harrell is a 49 y.o. female here today for a follow up visit.  Patient has past medical history of COPD and current tobacco use. Patient reports that she has had the cough and fatigue since discharged from the hospital in November of last year. Patient admits to never picked up prescription of Advair due to cost. She states that she has constantly felt weak and exhausted. She feels sad and depressed because she is going through so much. She feels like she has pneumonia. Patient still currently smokes 5 cigarettes per day.  Patient reports that she has experienced some chills and night sweats. She is concerned because she is unable to gain weight and feels like she has lost weight since last visit.   No Known Allergies Past Medical History  Diagnosis Date  . Bronchitis   . Shortness of breath dyspnea 10/28/2014  . Depression   . Meningitis spinal     AS CHILD    Current Outpatient Prescriptions on File Prior to Visit  Medication Sig Dispense Refill  . Fluticasone-Salmeterol (ADVAIR) 100-50 MCG/DOSE AEPB Inhale 1 puff into the lungs 2 (two) times daily. 1 each 3  . guaiFENesin (MUCINEX) 600 MG 12 hr tablet Take 1 tablet (600 mg total) by mouth 2 (two) times daily. 30 tablet 0  . ipratropium-albuterol (DUONEB) 0.5-2.5 (3) MG/3ML SOLN Take 3 mLs by nebulization every 4 (four) hours as needed. 360 mL 1  . IRON PO Take 1 tablet by mouth daily.    . Multiple Vitamin (MULTIVITAMIN WITH MINERALS) TABS tablet Take 1 tablet by mouth daily.    . predniSONE (STERAPRED UNI-PAK) 10 MG tablet Take 6-5-4-3-2-1 tablets by mouth daily till gone. 21 tablet 0   No current facility-administered medications on file prior to visit.   History reviewed. No pertinent family history. Social History   Social History  . Marital Status: Single    Spouse Name: N/A  . Number of Children: N/A  . Years of Education:  N/A   Occupational History  . Not on file.   Social History Main Topics  . Smoking status: Current Some Day Smoker -- 0.25 packs/day for 36 years    Types: Cigarettes  . Smokeless tobacco: Never Used     Comment: states she cut back to 3 cigarettes/day  . Alcohol Use: 0.0 oz/week    0 Standard drinks or equivalent per week     Comment: occ  . Drug Use: Yes    Special: Marijuana     Comment: occ  . Sexual Activity: Not on file   Other Topics Concern  . Not on file   Social History Narrative    Review of Systems  Constitutional: Positive for chills, weight loss and malaise/fatigue.  Respiratory: Positive for cough, sputum production (clear) and shortness of breath. Negative for wheezing.   Cardiovascular: Negative for chest pain, palpitations and leg swelling.  Gastrointestinal: Negative for nausea, vomiting and abdominal pain.  Neurological: Positive for weakness.  All other systems reviewed and are negative.  Objective:   Filed Vitals:   08/11/15 1205  BP: 129/97  Pulse: 83  Temp: 98.3 F (36.8 C)  Resp: 16    Physical Exam  Constitutional: She is oriented to person, place, and time. No distress.  Neck: No thyromegaly present.  Cardiovascular: Normal rate, regular rhythm and normal heart sounds.   No murmur heard.  Pulmonary/Chest: Effort normal and breath sounds normal. She has no wheezes. She exhibits no tenderness.  Musculoskeletal: She exhibits no edema.  Lymphadenopathy:    She has no cervical adenopathy.  Neurological: She is alert and oriented to person, place, and time.  Skin: Skin is warm. She is not diaphoretic.     Lab Results  Component Value Date   WBC 11.0* 10/29/2014   HGB 12.9 10/29/2014   HCT 38.0 10/29/2014   MCV 71.8* 10/29/2014   PLT 342 10/29/2014   Lab Results  Component Value Date   CREATININE 0.67 10/29/2014   BUN 8 10/29/2014   NA 140 10/29/2014   K 3.8 10/29/2014   CL 100 10/29/2014   CO2 21 10/29/2014    No results  found for: HGBA1C Lipid Panel  No results found for: CHOL, TRIG, HDL, CHOLHDL, VLDL, LDLCALC     Assessment and plan:   [name] was seen today for cough.  Diagnoses and all orders for this visit:  COPD exacerbation -     CBC with Differential -     COMPLETE METABOLIC PANEL WITH GFR -     Ambulatory referral to Pulmonology -     DG Chest 2 View; Future -     Begin guaiFENesin-codeine (CHERATUSSIN AC) 100-10 MG/5ML syrup; Take 5 mLs by mouth 3 (three) times daily as needed for cough. -     Begin doxycycline (VIBRA-TABS) 100 MG tablet; Take 1 tablet (100 mg total) by mouth 2 (two) times daily. -    Begin guaiFENesin (MUCINEX) 600 MG 12 hr tablet; Take 1 tablet (600 mg total) by mouth 2 (two) times daily. -     Begin Fluticasone-Salmeterol (ADVAIR) 100-50 MCG/DOSE AEPB; Inhale 1 puff into the lungs 2 (two) times daily. -    Began predniSONE (DELTASONE) 10 MG tablet; Take 1 tablet (10 mg total) by mouth daily with breakfast. 6-5-4-3-2-1. Take 6 pills today and decrease by 1 pill per day I've advised patient to please pick up prescription of Advair which will help give her better control of symptoms of cough. Per history and physical I believe patient is an COPD extubation and will begin her on steroid taper and antibodies. Patient may use Mucinex and Cheratussin--for severe cough.   Loss of weight -     TSH -     Ambulatory referral to Gastroenterology -     T4, Free Patient is a previous history of a low TSH level, will recheck again today. I will also get chest x-ray to make sure there is no cancerous process due to patient's history of tobacco abuse.  Other fatigue -     Hepatitis panel, acute Patient would like to be tested for HIV, syphilis, hepatitis. Explained to patient that fatigue may be, from excessive cough.  Tobacco abuse disorder  Smoking cessation discussed for 3 minutes, patient is not willing to quit at this time. Will continue to assess on each visit. Discussed increased  risk for diseases such as cancer, heart disease, and stroke.  Advised patient to stop smoking ASAP as it was only increase complications of COPD.   Return if symptoms worsen or fail to improve.        [name], NP-C [facility] [phone] 08/11/2015, 12:30 PM

## 2015-08-12 ENCOUNTER — Telehealth: Payer: Self-pay

## 2015-08-12 LAB — HEPATITIS PANEL, ACUTE
HCV AB: NEGATIVE
HEP A IGM: NONREACTIVE
Hep B C IgM: NONREACTIVE
Hepatitis B Surface Ag: NEGATIVE

## 2015-08-12 NOTE — Telephone Encounter (Signed)
Spoke with patient this am about her lab results and chest xray results Patient was able to obtain her advair and is feeling a little better Patient has a scheduled appointment with pulmonology on 9/13 Patient also has a colonoscopy scheduled for next tuesday

## 2015-08-16 ENCOUNTER — Other Ambulatory Visit: Payer: 59

## 2015-08-16 ENCOUNTER — Telehealth: Payer: Self-pay

## 2015-08-16 ENCOUNTER — Ambulatory Visit (INDEPENDENT_AMBULATORY_CARE_PROVIDER_SITE_OTHER): Payer: 59 | Admitting: Gastroenterology

## 2015-08-16 ENCOUNTER — Encounter: Payer: Self-pay | Admitting: Gastroenterology

## 2015-08-16 VITALS — BP 100/68 | HR 88 | Ht 69.0 in | Wt 120.4 lb

## 2015-08-16 DIAGNOSIS — R197 Diarrhea, unspecified: Secondary | ICD-10-CM | POA: Diagnosis not present

## 2015-08-16 DIAGNOSIS — R634 Abnormal weight loss: Secondary | ICD-10-CM

## 2015-08-16 MED ORDER — NA SULFATE-K SULFATE-MG SULF 17.5-3.13-1.6 GM/177ML PO SOLN: ORAL | Status: DC

## 2015-08-16 NOTE — Assessment & Plan Note (Signed)
Worsening diarrhea and weight loss raise the question of malabsorption, perhaps from celiac disease.  Note patient has a microcytic anemia.  Chronic infection with pseudomembranous colitis is less likely in view of the longevity of symptoms.  Occult neoplasm including colonic neoplasm such as a secreting villous adenoma should be considered.  Recommendations #1 check stool for C. difficile toxin and [place] stain #2 celiac panel #3 colonoscopy

## 2015-08-16 NOTE — Telephone Encounter (Signed)
Spoke with patient this am and she is aware of her x ray results

## 2015-08-16 NOTE — Progress Notes (Signed)
_                                                                                                                History of Present Illness:  Cindy Harrell is a 49 year old Afro-American female referred at the request of Dr.  Luna Glasgow for evaluation of diarrhea and weight loss.  While she's had mild postprandial loose stools for years,  Symptoms clearly have been worsening over the past year.  He has at least 3-4 watery stools a day and at night.  She denies abdominal pain or bleeding.  She has COPD and takes antibiotics about twice a year.  She's had a 20 pound weight loss over the past year.  She does not drink regularly.  Recent lab was pertinent for hemoglobin 12.4 and MCV 73.6.  She status post hysterectomy.     Past Medical History  Diagnosis Date  . Bronchitis   . Shortness of breath dyspnea 10/28/2014  . Depression   . Meningitis spinal     AS CHILD    Past Surgical History  Procedure Laterality Date  . Cesarean section    . Abdominal hysterectomy     family history includes Emphysema in her father; Pancreatic cancer in her mother. Current Outpatient Prescriptions  Medication Sig Dispense Refill  . doxycycline (VIBRA-TABS) 100 MG tablet Take 1 tablet (100 mg total) by mouth 2 (two) times daily. 20 tablet 0  . Fluticasone-Salmeterol (ADVAIR) 100-50 MCG/DOSE AEPB Inhale 1 puff into the lungs 2 (two) times daily. 1 each 3  . guaiFENesin (MUCINEX) 600 MG 12 hr tablet Take 1 tablet (600 mg total) by mouth 2 (two) times daily. 30 tablet 0  . guaiFENesin-codeine (CHERATUSSIN AC) 100-10 MG/5ML syrup Take 5 mLs by mouth 3 (three) times daily as needed for cough. 120 mL 0  . ipratropium-albuterol (DUONEB) 0.5-2.5 (3) MG/3ML SOLN Take 3 mLs by nebulization every 4 (four) hours as needed. 360 mL 1  . predniSONE (DELTASONE) 10 MG tablet Take 1 tablet (10 mg total) by mouth daily with breakfast. 6-5-4-3-2-1. Take 6 pills today and decrease by 1 pill per day 21 tablet 0  . IRON PO  Take 1 tablet by mouth daily.    . Multiple Vitamin (MULTIVITAMIN WITH MINERALS) TABS tablet Take 1 tablet by mouth daily.     No current facility-administered medications for this visit.   Allergies as of 08/16/2015  . (No Known Allergies)    reports that she has been smoking Cigarettes.  She has a 9 pack-year smoking history. She has never used smokeless tobacco. She reports that she drinks alcohol. She reports that she uses illicit drugs (Marijuana).   Review of Systems: Pertinent positive and negative review of systems were noted in the above HPI section. All other review of systems were otherwise negative.  Vital signs were reviewed in today's medical record Physical Exam: General: thin female in no acute distress Skin: anicteric Head: Normocephalic and atraumatic Eyes:  sclerae anicteric, EOMI Ears: Normal auditory acuity Mouth: No  deformity or lesions Neck: Supple, no masses or thyromegaly Lymph Nodes: no lymphadenopathy Lungs: Clear throughout to auscultation Heart: Regular rate and rhythm; no murmurs, rubs or bruits Gastroinestinal: Soft, non tender and non distended. No masses, hepatosplenomegaly or hernias noted. Normal Bowel sounds Rectal:deferred Musculoskeletal: Symmetrical with no gross deformities  Skin: No lesions on visible extremities Pulses:  Normal pulses noted Extremities: No clubbing, cyanosis, edema or deformities noted Neurological: Alert oriented x 4, grossly nonfocal Cervical Nodes:  No significant cervical adenopathy Inguinal Nodes: No significant inguinal adenopathy Psychological:  Alert and cooperative. Normal mood and affect  See Assessment and Plan under Problem List

## 2015-08-16 NOTE — Patient Instructions (Signed)
Your physician has requested that you go to the basement for lab work before leaving today.  You have been scheduled for a colonoscopy. Please follow written instructions given to you at your visit today.  Please pick up your prep supplies at the pharmacy within the next 1-3 days. If you use inhalers (even only as needed), please bring them with you on the day of your procedure. Your physician has requested that you go to www.startemmi.com and enter the access code given to you at your visit today. This web site gives a general overview about your procedure. However, you should still follow specific instructions given to you by our office regarding your preparation for the procedure.  We have sent the following medications to your pharmacy for you to pick up at your convenience: Suprep   

## 2015-08-16 NOTE — Telephone Encounter (Signed)
-----   Message from [name], NP sent at 08/15/2015 10:38 AM EDT ----- Normal chest x-ray, I still want patient to attend pulmonology visit. She needs better control of her COPD

## 2015-08-17 LAB — GLIA (IGA/G) + TTG IGA
GLIADIN IGA: 7 U (ref <20)
GLIADIN IGG: 2 U (ref <20)
Tissue Transglutaminase Ab, IgA: 1 U/mL (ref <4)

## 2015-08-17 LAB — CLOSTRIDIUM DIFFICILE BY PCR: CDIFFPCR: NOT DETECTED

## 2015-08-18 ENCOUNTER — Telehealth: Payer: Self-pay | Admitting: Gastroenterology

## 2015-08-18 NOTE — Telephone Encounter (Signed)
Patient advised I have placed samples of Suprep at front desk for pick up.

## 2015-08-23 ENCOUNTER — Ambulatory Visit (INDEPENDENT_AMBULATORY_CARE_PROVIDER_SITE_OTHER): Payer: 59 | Admitting: Pulmonary Disease

## 2015-08-23 ENCOUNTER — Encounter: Payer: Self-pay | Admitting: Pulmonary Disease

## 2015-08-23 VITALS — BP 98/60 | HR 93 | Temp 98.0°F | Ht 69.0 in | Wt 120.6 lb

## 2015-08-23 DIAGNOSIS — Z720 Tobacco use: Secondary | ICD-10-CM

## 2015-08-23 DIAGNOSIS — J441 Chronic obstructive pulmonary disease with (acute) exacerbation: Secondary | ICD-10-CM

## 2015-08-23 DIAGNOSIS — F17200 Nicotine dependence, unspecified, uncomplicated: Secondary | ICD-10-CM

## 2015-08-23 DIAGNOSIS — J449 Chronic obstructive pulmonary disease, unspecified: Secondary | ICD-10-CM | POA: Insufficient documentation

## 2015-08-23 MED ORDER — FLUTICASONE-SALMETEROL 100-50 MCG/DOSE IN AEPB: 1.0000 | INHALATION_SPRAY | Freq: Two times a day (BID) | RESPIRATORY_TRACT | Status: AC

## 2015-08-23 MED ORDER — IPRATROPIUM-ALBUTEROL 0.5-2.5 (3) MG/3ML IN SOLN: 3.0000 mL | Freq: Three times a day (TID) | RESPIRATORY_TRACT | Status: AC

## 2015-08-23 NOTE — Patient Instructions (Signed)
[name]-- it was nice meeting you today...  As we discussed> it is imperative that you QUIT ALL THE SMOKING IMMEDIATELY to preserve your remaining lung function...    You will likely follow in your father's footsteps if you don't quit smoking completely...    Try the nicotine replacement options like the nicotine patch, lozenges, gum, etc...  Continue your ADVAIR one inhalation twice daily...  Increase your NEBULIZER treatments to 3 times daily on a regular schedule...  Increase your MUCINEX  tabs to 2 tabs twice daily w/ extra water...  You may also use OTC DELSYM- 2 tsp twice daily as needed for cough...    And you can try cough drops, throat lozenges etc...  Be sure to get all the indicated vaccinations from [facility]...  Try your best to avoid infections...  Call for any questions...  Let's plan a follow up visit in 6-8 weeks, sooner if needed for problems.Marland KitchenMarland Kitchen

## 2015-08-23 NOTE — Progress Notes (Signed)
Subjective:     Patient ID: Cindy Harrell, female   DOB: [birth year], 49 y.o.   MRN: [id number]  HPI 49 y/o BF referred by Holland Commons, NP- [facility], for a pulmonary evaluation due to cough & dyspnea>       Cindy Harrell was diagnosed w/ COPD in the late 2000's -- she is a current everyday smoker having started at 81 & been smoking for 37 yrs up to 1ppd she says, now down to 1/2ppd + MJ cigarettes frequently;  She is c/o cough, mostly dry but occas prod of a sm amt of whitish sput, no hemoptysis; she notes SOB "all the time" but she states that ADLs are ok- she gets SOB on stairs, walking her dog, etc but notes that it's been the same for several yrs, perhaps sl worse since her 10/2014 hospitalization; she has a generally pos ROS w/ mult somatic complaints & admits to drinking 24oz beer daily & using drugs- mostly "herbs"...       [facility] was [facility] [phone] at [facility]> presented w/ a URI and COPD exac w/ SOB/ wheezing; CXR showed COPD but no acute changes, Low-dose screening CT Chest 10/2014 showed few small bilat subpleural nodules noted to be sl smaller compared to CT Chest 2008, no new lesions, heart norm size, no adenopathy;  LABS showed BS=110-140, CBC showed MCV=72 w/ Hg=12.9 & WBC=11.0;  She was treated w/ IV antibiotics (disch on Levaquin), IV solumedrol (disch on Pred), NEBS (disch on Neb w/ DuonebQ4H as needed), & Mucinex; she did not require supplemental oxygen...  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       Current Meds> Off recent Doxy 7 Pred; NEBS w/ ADVAIR100Bid, DuonebQ4H prn (using ave once per  day), Mucinex600- ave taking one per day; prev on MVI + Fe but she has stopped these as well...   EXAM showed Afeb, VSS, O2sat=100% on RA at rest;  HEENT- neg, mallampati1;  Chest- sl decr BS bilat, clear w/o w/r/r;  Heart- RR w/o m/r/g;  Abd- soft, neg;  Ext- w/o c/c/e...   Last CXR 08/11/15 showed norm heart size, stable hyperinflation, clear lungs, NAD...   Spirometry 08/23/15 showed FVC=3.48 (105%), FEV1=2.29 (87%), %1sec=66, mid-flows were reduced at 44% predicted;  This is c/w a mild obstructive ventilatory defect & GOLD Stage 1 COPD...  Ambulatory oxygen saturation test 08/23/15> O2sat=99% on RA at rest w/ pulse=77/min; she ambulated 3 laps in the office w/ lowest O2sat=99% w/ max pulse=91/min...   IMP/PLAN >>     Mild obstructive lung dis c/w GOLD Stage 1 COPD -- on ADVAIR100- sp Bid, Home NEB w/ Duoneb Tid, Mucinex600-2Bid w/ fluids; it would be reasonable to check A1AT level at next lab draw...    Cigarette smoker -- she must quit smoking completely, rec to try nicotine replacement options, consider chantix etc if necessary...    Mult small bilat subpleural nodules on CT Chest -- no change from 2008 CT to most recent Nov2015 CT & she should continue in the Low-dose screening CT program for the early detection of lung cancer... Other medical issues >>  GI eval for diarrhea & weight loss -- on going eval by DrKaplan (CDiff neg, Celiac panel neg, Colonoscopy pending)...     Normal CBC w/ low MCV~72 and she needs anemia work up w/ Iron studies, B12, etc...     Pt w/ mult somatic complaints...     Past Medical History  Diagnosis Date  . Bronchitis   . Shortness of breath dyspnea 10/28/2014  . Depression   . Meningitis spinal     AS CHILD   . Asthma   . Allergic rhinitis     Past Surgical History  Procedure Laterality Date  . Cesarean section    . Abdominal hysterectomy      Outpatient Encounter Prescriptions as of 08/23/2015  Medication Sig  . doxycycline (VIBRA-TABS) 100  MG tablet Take 1 tablet (100 mg total) by mouth 2 (two) times daily.  . Fluticasone-Salmeterol (ADVAIR) 100-50 MCG/DOSE AEPB Inhale 1 puff into the lungs 2 (two) times daily.  Marland Kitchen guaiFENesin (MUCINEX) 600 MG 12 hr tablet Take 1 tablet (600 mg total) by mouth 2 (two) times daily.  Marland Kitchen guaiFENesin-codeine (CHERATUSSIN AC) 100-10 MG/5ML syrup Take 5 mLs by mouth 3 (three) times daily as needed for cough.  Marland Kitchen ipratropium-albuterol (DUONEB) 0.5-2.5 (3) MG/3ML SOLN Take 3 mLs by nebulization 3 (three) times daily.  . [DISCONTINUED] Fluticasone-Salmeterol (ADVAIR) 100-50 MCG/DOSE AEPB Inhale 1 puff into the lungs 2 (two) times daily.  . [DISCONTINUED] ipratropium-albuterol (DUONEB) 0.5-2.5 (3) MG/3ML SOLN Take 3 mLs by nebulization every 4 (four) hours as needed.  . IRON PO Take 1 tablet by mouth daily.  . Multiple Vitamin (MULTIVITAMIN WITH MINERALS) TABS tablet Take 1 tablet by mouth daily.  . Na Sulfate-K Sulfate-Mg Sulf SOLN Use as directed per colonoscopy. (Patient not taking: Reported on 08/23/2015)  . predniSONE (DELTASONE) 10 MG tablet Take 1 tablet (10 mg total) by mouth daily with breakfast. 6-5-4-3-2-1. Take 6 pills today and decrease by 1 pill per day (Patient not taking: Reported on 08/23/2015)   No facility-administered encounter medications on file as of 08/23/2015.    No Known Allergies   Immunization History  Administered Date(s) Administered  . Influenza,inj,Quad PF,36+ Mos 10/29/2014  . Pneumococcal Polysaccharide-23 11/03/2014    Family History  Problem Relation Age of Onset  . Pancreatic cancer Mother   . Emphysema Father     Social History   Social History  . Marital Status: Single    Spouse Name: N/A  . Number of Children: 2  . Years of Education: N/A   Occupational History  . Not on file.   Social History Main Topics  . Smoking status: Current Some Day Smoker -- 0.25 packs/day for 36 years    Types: Cigarettes  . Smokeless tobacco: Never Used     Comment: states  she cut back to 3 cigarettes/day  . Alcohol Use: 0.0 oz/week    0 Standard drinks or equivalent per week     Comment: occ  . Drug Use: Yes    Special: Marijuana     Comment: occ  . Sexual Activity: Not on file   Other Topics Concern  . Not on file   Social History Narrative    Current Medications, Allergies, Past Medical History, Past Surgical History, Family History, and Social History were reviewed in [employer] electronic medical record   Review of Systems            All symptoms NEG except where BOLDED >>  Constitutional:  F/C/S, fatigue, anorexia, unexpected weight  change. HEENT:  HA, visual changes, hearing loss, earache, nasal symptoms, sore throat, mouth sores, hoarseness. Resp:  cough, sputum, hemoptysis; SOB, tightness, wheezing. Cardio:  CP, palpit, DOE, orthopnea, edema. GI:  N/V/D/C, blood in stool; reflux, abd pain, distention, gas. GU:  dysuria, freq, urgency, hematuria, flank pain, voiding difficulty. MS:  joint pain, swelling, tenderness, decr ROM; neck pain, back pain, etc. Neuro:  HA, tremors, seizures, dizziness, syncope, weakness, numbness, gait abn. Skin:  suspicious lesions or skin rash. Heme:  adenopathy, bruising, bleeding. Psyche:  confusion, agitation, sleep disturbance, hallucinations, anxiety, depression suicidal.   Objective:   Physical Exam      Vital Signs:  Reviewed...  General:  WD, thin, 49 y/o WF in NAD; alert & oriented; pleasant & cooperative... HEENT:  Trion/AT; Conjunctiva- pink, Sclera- nonicteric, EOM-wnl, PERRLA, EACs-clear, TMs-wnl; NOSE-clear; THROAT-clear & wnl. Neck:  Supple w/ fair ROM; no JVD; normal carotid impulses w/o bruits; no thyromegaly or nodules palpated; no lymphadenopathy. Chest:  sl decr BS bilat, clear to P & A; without wheezes, rales, or rhonchi heard. Heart:  Regular Rhythm; norm S1 & S2 without murmurs, rubs, or gallops detected. Abdomen:  Soft & nontender- no guarding or rebound; normal bowel sounds; no  organomegaly or masses palpated. Ext:  Normal ROM; without deformities or arthritic changes; no varicose veins, venous insuffic, or edema;  Pulses intact w/o bruits. Neuro:  No focal neuro deficitsl; sensory testing normal; gait normal & balance OK. Derm:  No lesions noted; no rash etc. Lymph:  No cervical, supraclavicular, axillary, or inguinal adenopathy palpated.   Assessment:      IMP/PLAN >>     Mild obstructive lung dis c/w GOLD Stage 1 COPD -- on ADVAIR100- sp Bid, Home NEB w/ Duoneb Tid, Mucinex600-2Bid w/ fluids; it would be reasonable to check A1AT level at next lab draw...    Cigarette smoker -- she must quit smoking completely, rec to try nicotine replacement options, consider chantix etc if necessary...    Mult small bilat subpleural nodules on CT Chest -- no change from 2008 CT to most recent Nov2015 CT & she should continue in the Low-dose screening CT program for the early detection of lung cancer...  Other medical issues >>     GI eval for diarrhea & weight loss -- on going eval by DrKaplan (CDiff neg, Celiac panel neg, Colonoscopy pending)...     Normal CBC w/ low MCV~72 and she needs anemia work up w/ Iron studies, B12, etc...     Pt w/ mult somatic complaints...      Plan:     Patient's Medications  New Prescriptions   No medications on file  Previous Medications   DOXYCYCLINE (VIBRA-TABS) 100 MG TABLET                   <<course of Rx id complete>>    GUAIFENESIN (MUCINEX) 600 MG 12 HR TABLET    Take 2 tabs by mouth twice daily   GUAIFENESIN-CODEINE (CHERATUSSIN AC) 100-10 MG/5ML SYRUP    Take 5 mLs by mouth 3 (three) times daily as needed for cough.   IRON PO    Take 1 tablet by mouth daily.   MULTIPLE VITAMIN (MULTIVITAMIN WITH MINERALS) TABS TABLET    Take 1 tablet by mouth daily.   NA SULFATE-K SULFATE-MG SULF SOLN    Use as directed per colonoscopy.   PREDNISONE (DELTASONE) 10 MG TABLET                    <<  course of Rx is complete>>   Modified Medications     Modified Medication Previous Medication   FLUTICASONE-SALMETEROL (ADVAIR) 100-50 MCG/DOSE AEPB Fluticasone-Salmeterol (ADVAIR) 100-50 MCG/DOSE AEPB      Inhale 1 puff into the lungs 2 (two) times daily.    Inhale 1 puff into the lungs 2 (two) times daily.   IPRATROPIUM-ALBUTEROL (DUONEB) 0.5-2.5 (3) MG/3ML SOLN ipratropium-albuterol (DUONEB) 0.5-2.5 (3) MG/3ML SOLN      Take 3 mLs by nebulization 3 (three) times daily.    Take 3 mLs by nebulization every 4 (four) hours as needed.  Discontinued Medications   No medications on file

## 2015-08-29 ENCOUNTER — Encounter: Payer: Self-pay | Admitting: Gastroenterology

## 2015-08-29 ENCOUNTER — Ambulatory Visit (AMBULATORY_SURGERY_CENTER): Payer: 59 | Admitting: Gastroenterology

## 2015-08-29 VITALS — BP 106/76 | HR 77 | Temp 96.2°F | Resp 20 | Ht 69.0 in | Wt 120.0 lb

## 2015-08-29 DIAGNOSIS — K648 Other hemorrhoids: Secondary | ICD-10-CM | POA: Diagnosis not present

## 2015-08-29 DIAGNOSIS — R197 Diarrhea, unspecified: Secondary | ICD-10-CM

## 2015-08-29 DIAGNOSIS — K5730 Diverticulosis of large intestine without perforation or abscess without bleeding: Secondary | ICD-10-CM | POA: Diagnosis not present

## 2015-08-29 DIAGNOSIS — R634 Abnormal weight loss: Secondary | ICD-10-CM | POA: Diagnosis not present

## 2015-08-29 LAB — HM COLONOSCOPY: HM Colonoscopy: NORMAL

## 2015-08-29 MED ORDER — SODIUM CHLORIDE 0.9 % IV SOLN: 500.0000 mL | INTRAVENOUS | Status: DC

## 2015-08-29 NOTE — Progress Notes (Signed)
Report to PACU, RN, vss, BBS= Clear.  

## 2015-08-29 NOTE — Progress Notes (Signed)
Called to room to assist during endoscopic procedure.  Patient ID and intended procedure confirmed with present staff. Received instructions for my participation in the procedure from the performing physician.  

## 2015-08-29 NOTE — Patient Instructions (Signed)
Discharge instructions given. Handout on diverticulosis and hemorrhoids and  a high fiber diet. Begin Imodium 1-2 tabs every morning. Office visit in 3-4 weeks. Office will call to schedule. Resume previous medications. YOU HAD AN ENDOSCOPIC PROCEDURE TODAY AT THE [facility]:   Refer to the procedure report that was given to you for any specific questions about what was found during the examination.  If the procedure report does not answer your questions, please call your gastroenterologist to clarify.  If you requested that your care partner not be given the details of your procedure findings, then the procedure report has been included in a sealed envelope for you to review at your convenience later.  YOU SHOULD EXPECT: Some feelings of bloating in the abdomen. Passage of more gas than usual.  Walking can help get rid of the air that was put into your GI tract during the procedure and reduce the bloating. If you had a lower endoscopy (such as a colonoscopy or flexible sigmoidoscopy) you may notice spotting of blood in your stool or on the toilet paper. If you underwent a bowel prep for your procedure, you may not have a normal bowel movement for a few days.  Please Note:  You might notice some irritation and congestion in your nose or some drainage.  This is from the oxygen used during your procedure.  There is no need for concern and it should clear up in a day or so.  SYMPTOMS TO REPORT IMMEDIATELY:   Following lower endoscopy (colonoscopy or flexible sigmoidoscopy):  Excessive amounts of blood in the stool  Significant tenderness or worsening of abdominal pains  Swelling of the abdomen that is new, acute  Fever of 100F or higher   For urgent or emergent issues, a gastroenterologist can be reached at any hour by calling [phone].   DIET: Your first meal following the procedure should be a small meal and then it is ok to progress to your normal diet. Heavy or fried  foods are harder to digest and may make you feel nauseous or bloated.  Likewise, meals heavy in dairy and vegetables can increase bloating.  Drink plenty of fluids but you should avoid alcoholic beverages for 24 hours.  ACTIVITY:  You should plan to take it easy for the rest of today and you should NOT DRIVE or use heavy machinery until tomorrow (because of the sedation medicines used during the test).    FOLLOW UP: Our staff will call the number listed on your records the next business day following your procedure to check on you and address any questions or concerns that you may have regarding the information given to you following your procedure. If we do not reach you, we will leave a message.  However, if you are feeling well and you are not experiencing any problems, there is no need to return our call.  We will assume that you have returned to your regular daily activities without incident.  If any biopsies were taken you will be contacted by phone or by letter within the next 1-3 weeks.  Please call us at [phone] if you have not heard about the biopsies in 3 weeks.    SIGNATURES/CONFIDENTIALITY: You and/or your care partner have signed paperwork which will be entered into your electronic medical record.  These signatures attest to the fact that that the information above on your After Visit Summary has been reviewed and is understood.  Full responsibility of the confidentiality of this  discharge information lies with you and/or your care-partner.

## 2015-08-29 NOTE — Op Note (Signed)
[facility] [address] [place], [address]   COLONOSCOPY PROCEDURE REPORT  PATIENT: Cindy, Harrell  MR#: [id number] BIRTHDATE: [birth year] , 49  yrs. old GENDER: female ENDOSCOPIST: Louis Meckel, MD REFERRED BY:  Holland Commons, MD PROCEDURE DATE:  08/29/2015 PROCEDURE:   Colonoscopy with biopsy First Screening Colonoscopy - Avg.  risk and is 50 yrs.  old or older Yes.  Prior Negative Screening - Now for repeat screening. N/A  History of Adenoma - Now for follow-up colonoscopy & has been > or = to 3 yrs.  N/A  Polyps removed today? No Recommend repeat exam, <10 yrs? No ASA CLASS:   Class II INDICATIONS:Clinically significant diarrhea of unexplained origin.  MEDICATIONS: Monitored anesthesia care and Propofol 300 mg IV  DESCRIPTION OF PROCEDURE:   After the risks benefits and alternatives of the procedure were thoroughly explained, informed consent was obtained.  The digital rectal exam revealed no abnormalities of the rectum.   The LB PCF Q180 [id number]  endoscope was introduced through the anus and advanced to the cecum, which was identified by both the appendix and ileocecal valve. No adverse events experienced.   The quality of the prep was (Suprep was used) excellent.  The instrument was then slowly withdrawn as the colon was fully examined. Estimated blood loss is zero unless otherwise noted in this procedure report.      COLON FINDINGS: There was mild diverticulosis noted in the transverse colon and descending colon.   Internal hemorrhoids were found.   The examination was otherwise normal. Random biopsies were taken throughout the colon to rule out microscopic colitis Retroflexed views revealed no abnormalities. The time to cecum = 9.8 Withdrawal time = 7.0   The scope was withdrawn and the procedure completed. COMPLICATIONS: There were no immediate complications.  ENDOSCOPIC IMPRESSION: 1.   Mild diverticulosis was noted in the transverse  colon and descending colon 2.   Internal hemorrhoids  RECOMMENDATIONS: 1.  Await biopsy results 2.  Begin Imodium 1-2 tabs every morning 3.  Repeat Colonscopy in 10 years. 4.  Office visit 3-4 weeks  eSigned:  Louis Meckel, MD 08/29/2015 11:26 AM   cc:   PATIENT NAME:  Cindy, Harrell MR#: [id number]

## 2015-08-30 ENCOUNTER — Telehealth: Payer: Self-pay | Admitting: *Deleted

## 2015-08-30 NOTE — Telephone Encounter (Signed)
  Follow up Call-  Call back number 08/29/2015  Post procedure Call Back phone  # [phone]  Permission to leave phone message Yes     Patient questions:  Do you have a fever, pain , or abdominal swelling? No. Pain Score  0 *  Have you tolerated food without any problems? Yes.    Have you been able to return to your normal activities? Yes.    Do you have any questions about your discharge instructions: Diet   No. Medications  No. Follow up visit  No.  Do you have questions or concerns about your Care? No.  Actions: * If pain score is 4 or above: No action needed, pain <4.

## 2015-09-02 ENCOUNTER — Encounter: Payer: Self-pay | Admitting: Gastroenterology

## 2015-09-05 ENCOUNTER — Other Ambulatory Visit: Payer: Self-pay

## 2015-09-05 MED ORDER — ELUXADOLINE 100 MG PO TABS: 1.0000 | ORAL_TABLET | Freq: Two times a day (BID) | ORAL | Status: DC

## 2015-09-05 MED ORDER — ELUXADOLINE 100 MG PO TABS: 1.0000 | ORAL_TABLET | Freq: Two times a day (BID) | ORAL | Status: AC

## 2015-09-08 ENCOUNTER — Other Ambulatory Visit: Payer: Self-pay | Admitting: Internal Medicine

## 2015-09-27 ENCOUNTER — Ambulatory Visit: Payer: 59 | Admitting: Gastroenterology

## 2015-10-05 ENCOUNTER — Ambulatory Visit: Payer: 59 | Admitting: Pulmonary Disease

## 2015-10-10 ENCOUNTER — Ambulatory Visit: Payer: 59 | Admitting: Pulmonary Disease

## 2015-11-15 ENCOUNTER — Ambulatory Visit: Payer: 59 | Admitting: Gastroenterology

## 2016-03-15 IMAGING — CT CT CHEST LUNG CANCER SCREENING LOW DOSE W/O CM
1 of 3 series · 4 of 36 positions shown, 5 images · non-contrast
Comparison: CT 01/16/2007 and chest x-ray 10/28/2014.

CLINICAL DATA: Screening for lung mass Pt w/ severe cough, acute
bronchitis Per chart- hx of COPD exacerbation, tobacco use- pt
smokes pack a day for over 30 yrs Per referring [facility] specifically
wanted CT chest low dose

EXAM:
CT CHEST WITHOUT CONTRAST
TECHNIQUE: Multidetector CT imaging of the chest was performed following the
standard protocol without intravenous contrast. High resolution
imaging of the lungs, as well as inspiratory and expiratory imaging,
was performed.

[Series 605: st cor · coronal · 0.64mm/px · 4 of 71 slices shown, 5 images]
[im 15/71  mediastinal]
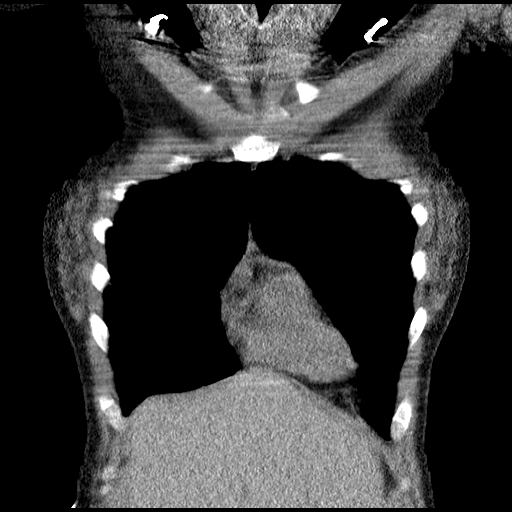
[im 15/71  lung]
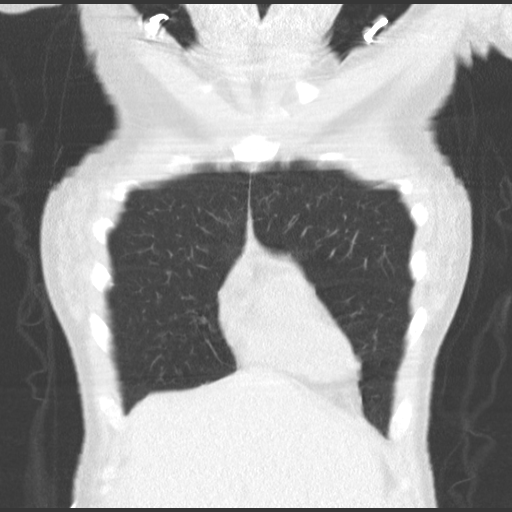
[im 29/71  lung]
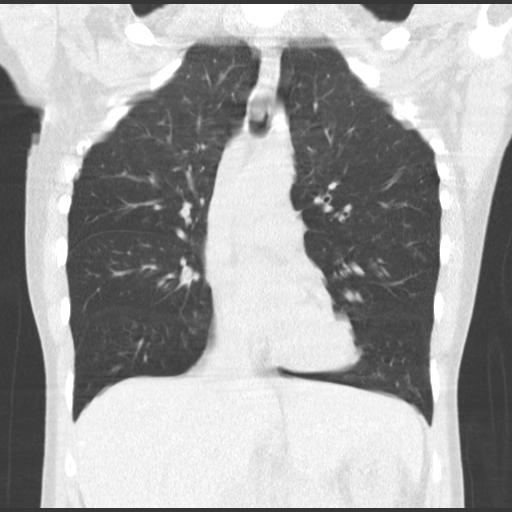
[im 43/71  lung]
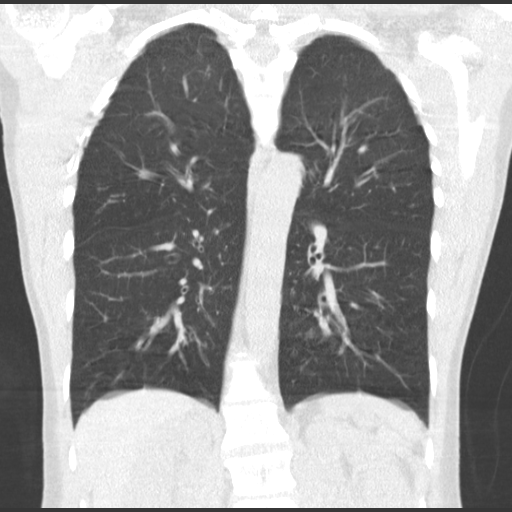
[im 57/71  lung]
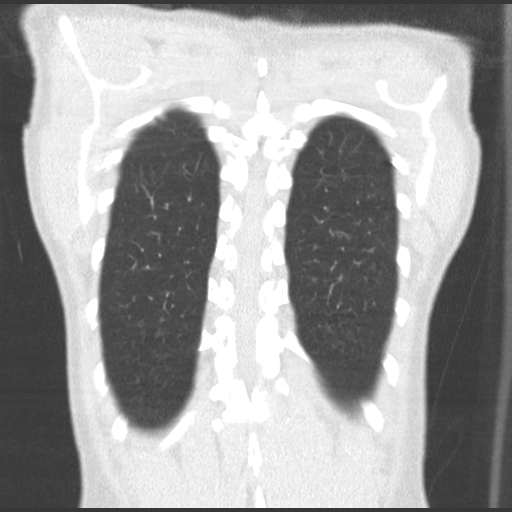

[4 of 36 positions shown; findings below may reference images not displayed]

FINDINGS: Lungs are well inflated without consolidation or effusion. There are
a few small bilateral subpleural nodules which are overall unchanged
to slightly smaller compared to the previous exam from 8663. No new
nodules identified. Airways are within normal. Heart is normal in
size. Of mediastinal hilar adenopathy. Remaining mediastinal
structures are unremarkable. Images through the upper abdomen are
unremarkable. Remaining bones soft tissues are within normal.
IMPRESSION: No acute cardiopulmonary disease.  No evidence of lung mass.

Several small bilateral peripheral pulmonary nodules overall
unchanged to slightly smaller compared to 8663 and therefore benign.
No new nodules identified.

## 2016-12-26 IMAGING — DX DG CHEST 2V
2 series · 2 of 2 positions shown · non-contrast
Comparison: 10/28/2014

CLINICAL DATA: Acute on chronic cough. Bilateral chronic chest pain
and shortness of breath. COPD exacerbation.

EXAM:
CHEST  2 VIEW

[chest pa]
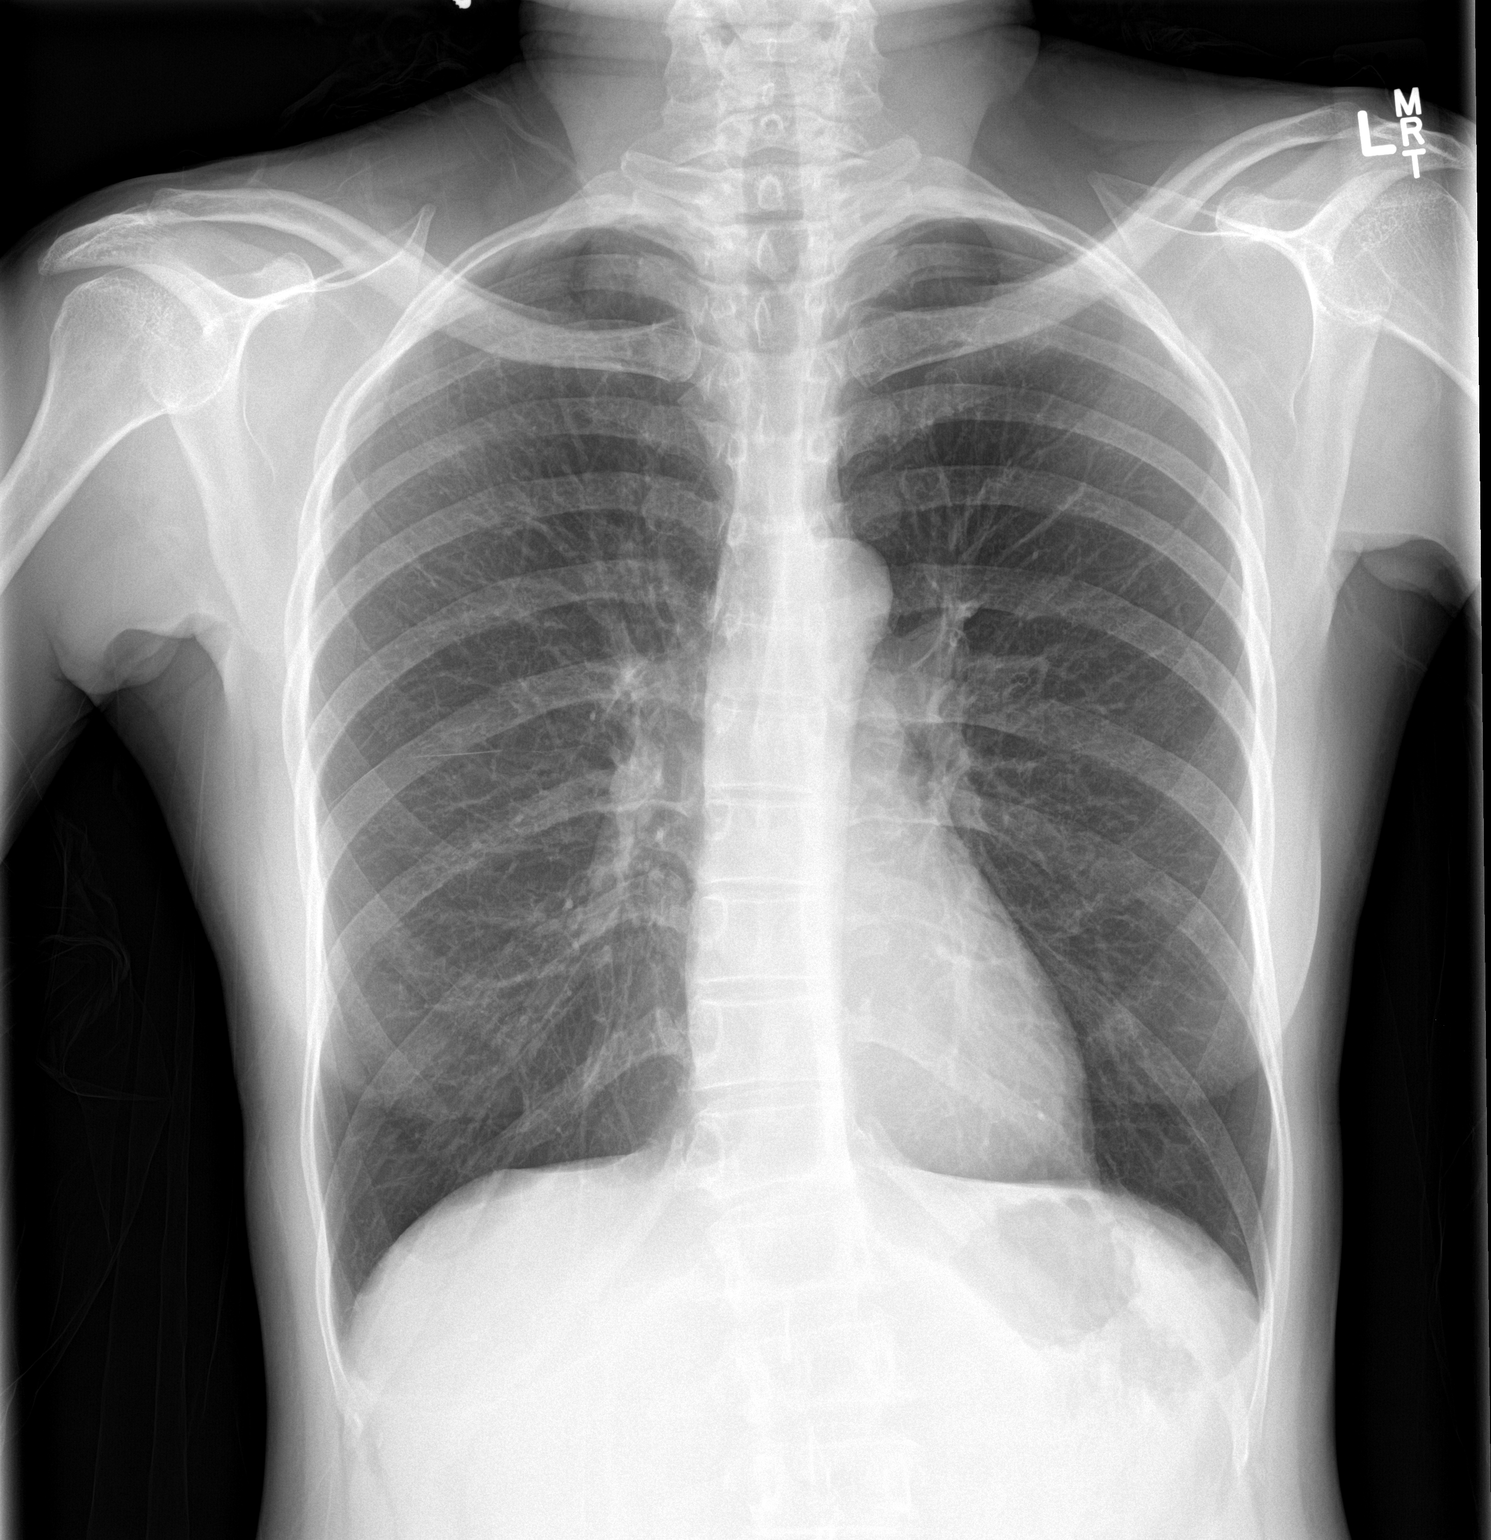

[chest lat]
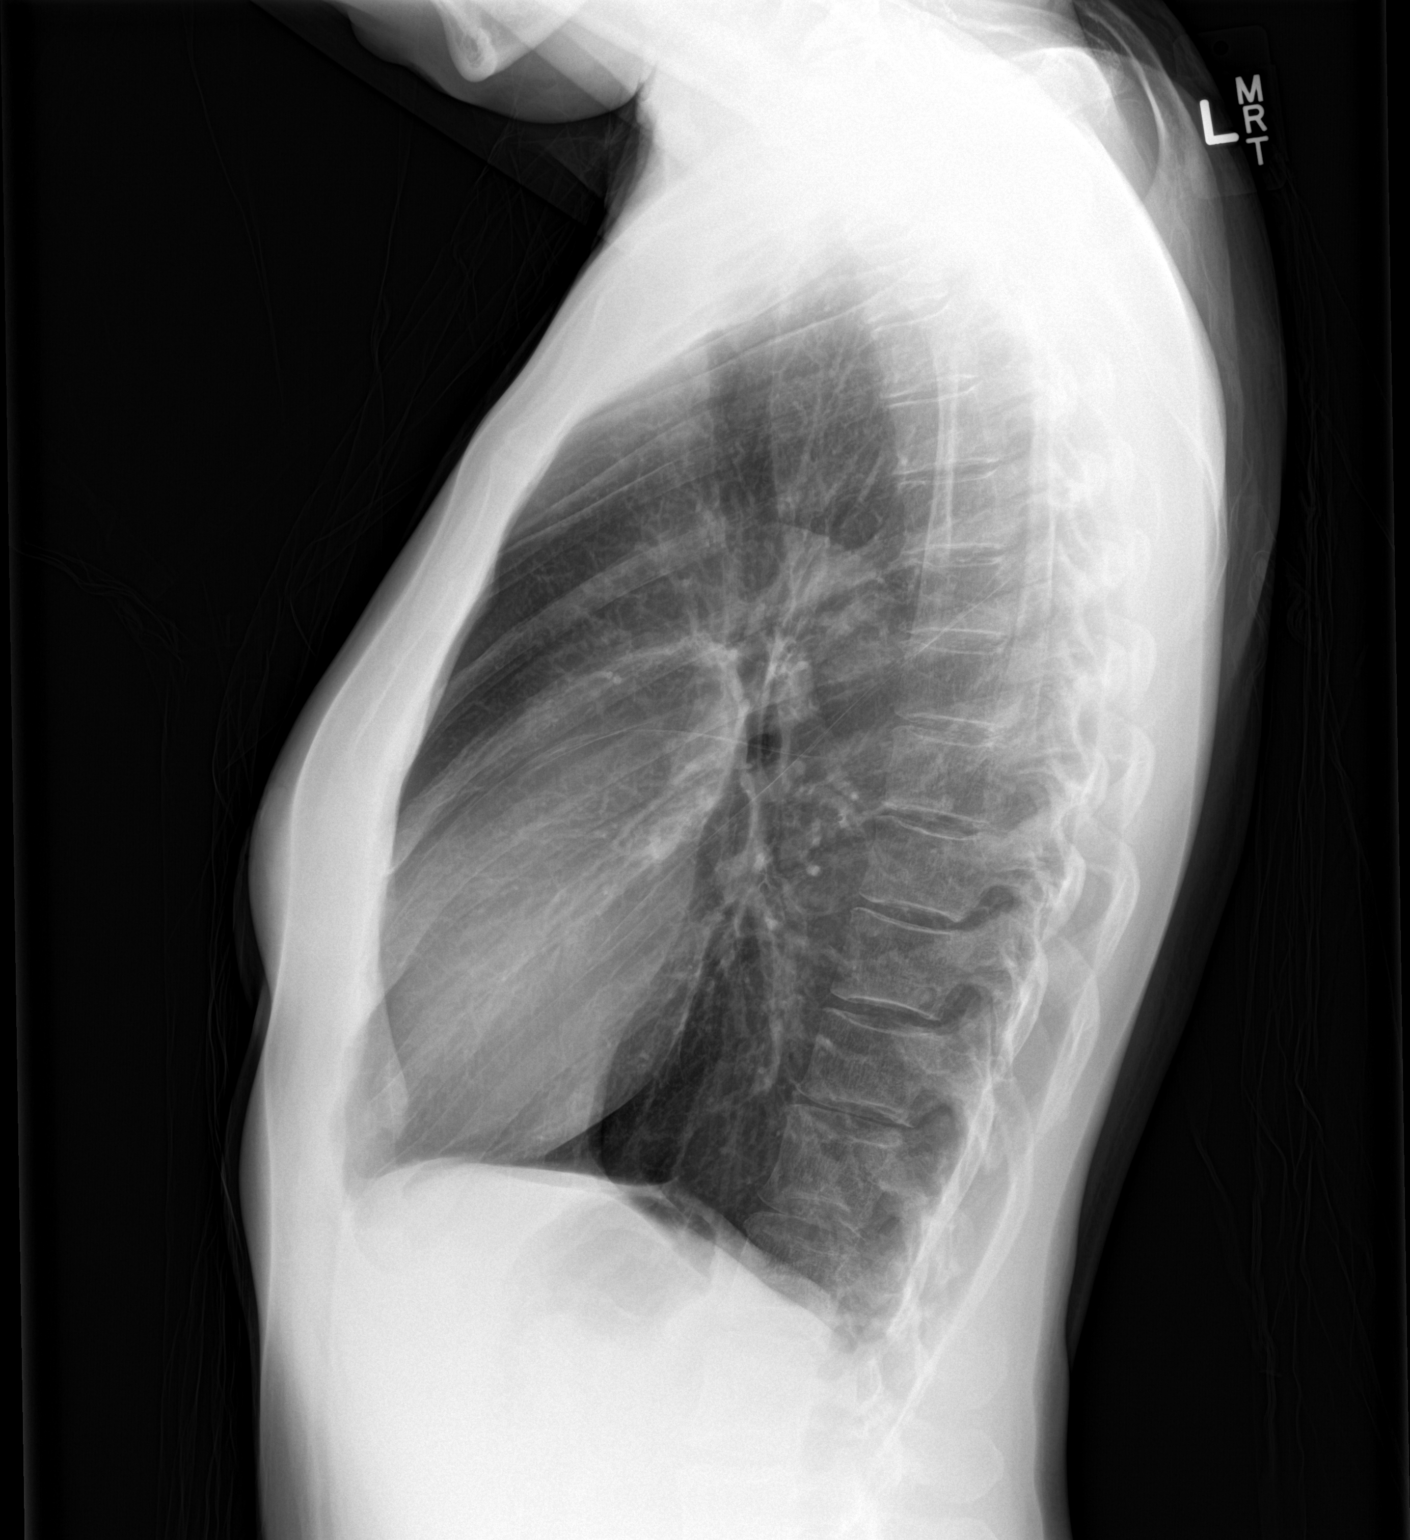

[2 of 2 positions shown; findings below may reference images not displayed]

FINDINGS: Stable hyperinflation. Heart and mediastinum are within normal
limits. The lungs are clear without airspace disease or pulmonary
edema. No acute bone abnormality.
IMPRESSION: No active cardiopulmonary disease.
